# Patient Record
Sex: Female | Born: 1995
Health system: Southern US, Community
[De-identification: ages and names within clinical notes are randomized; demographics above are authoritative.]

## PROBLEM LIST (undated history)

## (undated) ENCOUNTER — Inpatient Hospital Stay (HOSPITAL_COMMUNITY): Payer: Self-pay

## (undated) DIAGNOSIS — Z789 Other specified health status: Secondary | ICD-10-CM

## (undated) DIAGNOSIS — B009 Herpesviral infection, unspecified: Secondary | ICD-10-CM

## (undated) DIAGNOSIS — Z3490 Encounter for supervision of normal pregnancy, unspecified, unspecified trimester: Secondary | ICD-10-CM

## (undated) HISTORY — PX: NO PAST SURGERIES: SHX2092

---
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
## 2013-03-22 ENCOUNTER — Encounter (HOSPITAL_COMMUNITY): Payer: Self-pay | Admitting: Emergency Medicine

## 2013-03-22 ENCOUNTER — Emergency Department (HOSPITAL_COMMUNITY)
Admission: EM | Admit: 2013-03-22 | Discharge: 2013-03-22 | Disposition: A | Discharge status: Home / Self Care | Payer: Self-pay | Attending: Emergency Medicine | Admitting: Emergency Medicine

## 2013-03-22 ENCOUNTER — Emergency Department (HOSPITAL_COMMUNITY): Payer: Self-pay

## 2013-03-22 DIAGNOSIS — N949 Unspecified condition associated with female genital organs and menstrual cycle: Secondary | ICD-10-CM | POA: Insufficient documentation

## 2013-03-22 DIAGNOSIS — R102 Pelvic and perineal pain: Secondary | ICD-10-CM

## 2013-03-22 DIAGNOSIS — R51 Headache: Secondary | ICD-10-CM | POA: Insufficient documentation

## 2013-03-22 DIAGNOSIS — Z3202 Encounter for pregnancy test, result negative: Secondary | ICD-10-CM | POA: Insufficient documentation

## 2013-03-22 DIAGNOSIS — J029 Acute pharyngitis, unspecified: Secondary | ICD-10-CM | POA: Insufficient documentation

## 2013-03-22 LAB — RAPID STREP SCREEN (MED CTR MEBANE ONLY): Streptococcus, Group A Screen (Direct): NEGATIVE

## 2013-03-22 LAB — URINALYSIS, ROUTINE W REFLEX MICROSCOPIC
Bilirubin Urine: NEGATIVE
Glucose, UA: NEGATIVE mg/dL
Specific Gravity, Urine: 1.004 — ABNORMAL LOW (ref 1.005–1.030)
Urobilinogen, UA: 1 mg/dL (ref 0.0–1.0)
pH: 7 (ref 5.0–8.0)

## 2013-03-22 LAB — URINE MICROSCOPIC-ADD ON

## 2013-03-22 LAB — WET PREP, GENITAL
Trich, Wet Prep: NONE SEEN
Yeast Wet Prep HPF POC: NONE SEEN

## 2013-03-22 LAB — POCT PREGNANCY, URINE: Preg Test, Ur: NEGATIVE

## 2013-03-22 MED ORDER — DIAZEPAM 2 MG PO TABS: 5.0000 mg | ORAL_TABLET | Freq: Two times a day (BID) | ORAL | Status: DC

## 2013-03-22 MED ORDER — IBUPROFEN 800 MG PO TABS: 800.0000 mg | ORAL_TABLET | Freq: Three times a day (TID) | ORAL | Status: DC

## 2013-03-22 MED ORDER — KETOROLAC TROMETHAMINE 60 MG/2ML IM SOLN
60.0000 mg | Freq: Once | INTRAMUSCULAR | Status: AC
  Administered 2013-03-22: 60 mg via INTRAMUSCULAR
  Filled 2013-03-22: qty 2

## 2013-03-22 MED ORDER — DIAZEPAM 2 MG PO TABS
2.0000 mg | ORAL_TABLET | Freq: Once | ORAL | Status: AC
  Administered 2013-03-22: 2 mg via ORAL
  Filled 2013-03-22: qty 1

## 2013-03-22 NOTE — ED Notes (Signed)
PT. REPORTS HEADACHE / SORE THROAT  FOR 2 DAYS UNRELIEVED BY OTC NYQUIL.

## 2013-03-22 NOTE — ED Notes (Signed)
Pelvic cart at bedside. 

## 2013-03-22 NOTE — ED Provider Notes (Signed)
History    CSN: [id number] Arrival date & time 03/22/13  0110  None    Chief Complaint  Patient presents with  . Headache  . Sore Throat   (Consider location/radiation/quality/duration/timing/severity/associated sxs/prior Treatment) HPI History provided by pt.   Pt presents w/ multiple complaints.  Has had a sore throat for the past two days.  Has had non-traumatic frontal headache and posterior neck pain for 1-2 days.  Neck pain aggravated by ROM.  Also c/o diffuse lower abd pain since yesterday am.  Associated sx include increased urinary frequency and single episode of diarrhea.  Denies fever, dizziness, vision changes, extremity weakness/paresthesias, nasal congestion/rhinorrhea, cough, N/V, hematochezia/melena, vaginal sx.  No h/o migraines.  H/o multiple STDs.  No known sick contacts.  History reviewed. No pertinent past medical history. History reviewed. No pertinent past surgical history. No family history on file. History  Substance Use Topics  . Smoking status: Never Smoker   . Smokeless tobacco: Never Used  . Alcohol Use: No   OB History   Grav Para Term Preterm Abortions TAB SAB Ect Mult Living   1              Review of Systems  All other systems reviewed and are negative.    Allergies  Review of patient's allergies indicates no known allergies.  Home Medications   Current Outpatient Rx  Name  Route  Sig  Dispense  Refill  . Pseudoeph-Doxylamine-DM-APAP (NYQUIL PO)   Oral   Take 30 mLs by mouth every 8 (eight) hours as needed (for cold symptoms).          BP 143/69  Pulse 114  Temp(Src) 98.4 F (36.9 C) (Oral)  Resp 16  SpO2 98%  LMP 02/18/2013 Physical Exam  Nursing note and vitals reviewed. Constitutional: She is oriented to person, place, and time. She appears well-developed and well-nourished. No distress.  HENT:  Head: Normocephalic and atraumatic.  Eyes:  Normal appearance  Neck: Normal range of motion.  No meningeal signs.  Mild,  diffuse posterior neck ttp.   Cardiovascular: Normal rate, regular rhythm and intact distal pulses.   Pulmonary/Chest: Effort normal and breath sounds normal.  Abdominal: Soft. Bowel sounds are normal. She exhibits no distension and no mass. There is no rebound and no guarding.  Diffuse lower abd ttp  Genitourinary:  No CVA ttp.  Nml external genitalia.  Mild vaginal bleeding.  Cervix closed and appears nml.  Bilateral adnexal ttp  Musculoskeletal: Normal range of motion.  Neurological: She is alert and oriented to person, place, and time. No sensory deficit. Coordination normal.  CN 3-12 intact.  No nystagmus.  5/5 and equal upper and lower extremity strength.  No past pointing.    Skin: Skin is warm and dry. No rash noted.  Psychiatric: She has a normal mood and affect. Her behavior is normal.    ED Course  Procedures (including critical care time) Labs Reviewed  URINALYSIS, ROUTINE W REFLEX MICROSCOPIC - Abnormal; Notable for the following:    Specific Gravity, Urine 1.004 (*)    Hgb urine dipstick MODERATE (*)    All other components within normal limits  WET PREP, GENITAL  GC/CHLAMYDIA PROBE AMP  URINE MICROSCOPIC-ADD ON  POCT PREGNANCY, URINE   US Transvaginal Non-ob  03/22/2013   *RADIOLOGY REPORT*  Clinical Data:  Pelvic pain.  TRANSABDOMINAL AND TRANSVAGINAL ULTRASOUND OF PELVIS DOPPLER ULTRASOUND OF OVARIES  Technique:  Both transabdominal and transvaginal ultrasound examinations of the pelvis were performed.  Transabdominal technique was performed for global imaging of the pelvis including uterus, ovaries, adnexal regions, and pelvic cul-de-sac.  It was necessary to proceed with endovaginal exam following the transabdominal exam to visualize the ovaries in greater detail.  Color and duplex Doppler ultrasound was utilized to evaluate blood flow to the ovaries.  Comparison:  Pelvic ultrasound performed 10/27/2012  FINDINGS  Uterus:  Normal in size and appearance; measures 7.5 x 4.3  x 5.2 cm.  Retroverted in nature.  Endometrium:  Normal in size and appearance; measures 0.4 cm in thickness.  Right ovary: Normal appearance/no adnexal mass; measures 3.9 x 1.7 x 2.1 cm.  Left ovary: Normal appearance/no adnexal mass; measures 3.6 x 1.5 x 2.0 cm.  Pulsed Doppler evaluation demonstrates normal low-resistance arterial and venous waveforms in both ovaries.  Trace free fluid is seen within the pelvic cul-de-sac.  IMPRESSION:  Normal exam.  No evidence of pelvic mass or other significant abnormality.  No sonographic evidence for ovarian torsion.   Original Report Authenticated By: Tonia Ghent, M.D.   US Pelvis Complete  03/22/2013   *RADIOLOGY REPORT*  Clinical Data:  Pelvic pain.  TRANSABDOMINAL AND TRANSVAGINAL ULTRASOUND OF PELVIS DOPPLER ULTRASOUND OF OVARIES  Technique:  Both transabdominal and transvaginal ultrasound examinations of the pelvis were performed. Transabdominal technique was performed for global imaging of the pelvis including uterus, ovaries, adnexal regions, and pelvic cul-de-sac.  It was necessary to proceed with endovaginal exam following the transabdominal exam to visualize the ovaries in greater detail.  Color and duplex Doppler ultrasound was utilized to evaluate blood flow to the ovaries.  Comparison:  Pelvic ultrasound performed 10/27/2012  FINDINGS  Uterus:  Normal in size and appearance; measures 7.5 x 4.3 x 5.2 cm.  Retroverted in nature.  Endometrium:  Normal in size and appearance; measures 0.4 cm in thickness.  Right ovary: Normal appearance/no adnexal mass; measures 3.9 x 1.7 x 2.1 cm.  Left ovary: Normal appearance/no adnexal mass; measures 3.6 x 1.5 x 2.0 cm.  Pulsed Doppler evaluation demonstrates normal low-resistance arterial and venous waveforms in both ovaries.  Trace free fluid is seen within the pelvic cul-de-sac.  IMPRESSION:  Normal exam.  No evidence of pelvic mass or other significant abnormality.  No sonographic evidence for ovarian torsion.    Original Report Authenticated By: Tonia Ghent, M.D.   [place] Art/ven Flow Abd Pelv Doppler  03/22/2013   *RADIOLOGY REPORT*  Clinical Data:  Pelvic pain.  TRANSABDOMINAL AND TRANSVAGINAL ULTRASOUND OF PELVIS DOPPLER ULTRASOUND OF OVARIES  Technique:  Both transabdominal and transvaginal ultrasound examinations of the pelvis were performed. Transabdominal technique was performed for global imaging of the pelvis including uterus, ovaries, adnexal regions, and pelvic cul-de-sac.  It was necessary to proceed with endovaginal exam following the transabdominal exam to visualize the ovaries in greater detail.  Color and duplex Doppler ultrasound was utilized to evaluate blood flow to the ovaries.  Comparison:  Pelvic ultrasound performed 10/27/2012  FINDINGS  Uterus:  Normal in size and appearance; measures 7.5 x 4.3 x 5.2 cm.  Retroverted in nature.  Endometrium:  Normal in size and appearance; measures 0.4 cm in thickness.  Right ovary: Normal appearance/no adnexal mass; measures 3.9 x 1.7 x 2.1 cm.  Left ovary: Normal appearance/no adnexal mass; measures 3.6 x 1.5 x 2.0 cm.  Pulsed Doppler evaluation demonstrates normal low-resistance arterial and venous waveforms in both ovaries.  Trace free fluid is seen within the pelvic cul-de-sac.  IMPRESSION:  Normal exam.  No evidence of pelvic mass  or other significant abnormality.  No sonographic evidence for ovarian torsion.   Original Report Authenticated By: Tonia Ghent, M.D.   1. Headache   2. Sore throat   3. Pelvic pain     MDM  Healthy 17yo F presents w/ multiple complaints.   Has a sore throat.  Low clinical suspicion for strep and rapid strep screen neg.  Also c/o non-traumatic posterior neck pain.  No mid-line tenderness, no NV deficits of extremities and no meningeal signs.  Will treat symptomatically for cervical strain vs. Myalgias secondary to viral illness.  Pain improved in ED w/ IM toradol and po valium.  Also c/o lower abd pain.  Abd  soft/non-distended, diffuse lower abd ttp, bilateral adnexal tenderness.  Transvaginal US ordered d/t lack of f/u and was unremarkable.  Pt reassured.  D/c'd home w/ eight 2mg  valium as well as 800mg  ibuprofen.  Advised to return for worsening sx.    Otilio Miu, PA-C 03/23/13 [phone]

## 2013-03-22 NOTE — ED Notes (Signed)
Pt transported to US

## 2013-03-22 NOTE — ED Notes (Signed)
PA at bedside.

## 2013-03-23 NOTE — ED Provider Notes (Signed)
Medical screening examination/treatment/procedure(s) were performed by non-physician practitioner and as supervising physician I was immediately available for consultation/collaboration.   Jaamal Farooqui, MD 03/23/13 2213 

## 2013-03-24 LAB — CULTURE, GROUP A STREP

## 2013-03-27 ENCOUNTER — Emergency Department (HOSPITAL_COMMUNITY)
Admission: EM | Admit: 2013-03-27 | Discharge: 2013-03-27 | Disposition: A | Discharge status: Home / Self Care | Payer: Self-pay | Attending: Emergency Medicine | Admitting: Emergency Medicine

## 2013-03-27 ENCOUNTER — Encounter (HOSPITAL_COMMUNITY): Payer: Self-pay | Admitting: *Deleted

## 2013-03-27 DIAGNOSIS — H5789 Other specified disorders of eye and adnexa: Secondary | ICD-10-CM | POA: Insufficient documentation

## 2013-03-27 DIAGNOSIS — B309 Viral conjunctivitis, unspecified: Secondary | ICD-10-CM

## 2013-03-27 DIAGNOSIS — Z79899 Other long term (current) drug therapy: Secondary | ICD-10-CM | POA: Insufficient documentation

## 2013-03-27 MED ORDER — HYPROMELLOSE (GONIOSCOPIC) 2.5 % OP SOLN: 1.0000 [drp] | Freq: Once | OPHTHALMIC | Status: DC | PRN

## 2013-03-27 MED ORDER — FLUORESCEIN SODIUM 1 MG OP STRP
1.0000 | ORAL_STRIP | Freq: Once | OPHTHALMIC | Status: AC
Start: 2013-03-27 — End: 2013-03-27
  Administered 2013-03-27: 1 via OPHTHALMIC
  Filled 2013-03-27: qty 1

## 2013-03-27 MED ORDER — NAPHAZOLINE-PHENIRAMINE 0.025-0.3 % OP SOLN: 1.0000 [drp] | Freq: Four times a day (QID) | OPHTHALMIC | Status: DC

## 2013-03-27 MED ORDER — TETRACAINE HCL 0.5 % OP SOLN
1.0000 [drp] | Freq: Once | OPHTHALMIC | Status: AC
  Administered 2013-03-27: 1 [drp] via OPHTHALMIC
  Filled 2013-03-27: qty 2

## 2013-03-27 NOTE — ED Notes (Signed)
Pt is here with redness and irritation to right eye since Saturday.  No vision change

## 2013-03-27 NOTE — ED Provider Notes (Signed)
History  This chart was scribed for Glade Nurse, PA-C working with Enid Skeens, MD by Greggory Stallion, ED scribe. This patient was seen in room TR04C/TR04C and the patient's care was started at 3:36 PM.  CSN: [id number] Arrival date & time 03/27/13  1431   Chief Complaint  Patient presents with  . Eye Problem   The history is provided by the patient. No language interpreter was used.    HPI Comments: Sarah Hester is a 17 y.o. female who presents to the Emergency Department complaining of gradual onset, constant right eye irritation with associated right eye redness that started Saturday. This is a new problem. She rates the pain 4/10. Localized. Pt states there is no pain when she moves her eye side to side. Pt states when she first noticed it in the mirror her eye was just red. Pt states the next day it worsened and she had a clear discharge coming from her right eye. Pt states she has used Visene with some relief. Pt denies sick contact, fever, sore throat, rhinorrhea, loss of vision, photophobia, headache, and any visual changes. She states she does not typically have seasonal allergies.    History reviewed. No pertinent past medical history. History reviewed. No pertinent past surgical history. No family history on file. History  Substance Use Topics  . Smoking status: Never Smoker   . Smokeless tobacco: Never Used  . Alcohol Use: No   OB History   Grav Para Term Preterm Abortions TAB SAB Ect Mult Living   1              Review of Systems  Constitutional: Negative for fever and diaphoresis.  HENT: Negative for congestion, sore throat, rhinorrhea, sneezing, neck pain, neck stiffness and sinus pressure.   Eyes: Positive for redness. Negative for photophobia, pain and visual disturbance. Eye discharge: clear.  Respiratory: Negative for apnea, chest tightness and shortness of breath.   Cardiovascular: Negative for chest pain and palpitations.  Gastrointestinal: Negative for  nausea and vomiting.  Genitourinary: Negative for dysuria.  Musculoskeletal: Negative for gait problem.  Skin: Negative for rash.  Neurological: Negative for dizziness and light-headedness.    Allergies  Review of patient's allergies indicates no known allergies.  Home Medications   Current Outpatient Rx  Name  Route  Sig  Dispense  Refill  . Multiple Vitamins-Minerals (MULTIVITAMIN PO)   Oral   Take 1 tablet by mouth daily.         Marland Kitchen tetrahydrozoline (VISINE) 0.05 % ophthalmic solution   Right Eye   Place 1 drop into the right eye 2 (two) times daily as needed (for eye irritation).          BP 129/69  Pulse 110  Temp(Src) 99 F (37.2 C) (Oral)  Resp 18  SpO2 98%  LMP 03/20/2013  Physical Exam  Nursing note and vitals reviewed. Constitutional: She is oriented to person, place, and time. She appears well-developed and well-nourished. No distress.  HENT:  Head: Normocephalic and atraumatic.  Eyes: EOM are normal. Pupils are equal, round, and reactive to light. No foreign bodies found. Right eye exhibits discharge. Right eye exhibits no exudate and no hordeolum. No foreign body present in the right eye. Right conjunctiva is not injected. Right conjunctiva has no hemorrhage. Right eye exhibits normal extraocular motion.    Sclera of right eye has mild redness. Conjunctiva are not injected. Clear discharge noted.   Neck: Normal range of motion. Neck supple.  No meningeal signs  Cardiovascular: Normal rate, regular rhythm and normal heart sounds.  Exam reveals no gallop and no friction rub.   No murmur heard. Pulmonary/Chest: Effort normal and breath sounds normal. No respiratory distress. She has no wheezes. She has no rales. She exhibits no tenderness.  Abdominal: Soft. Bowel sounds are normal. She exhibits no distension. There is no tenderness. There is no rebound and no guarding.  Musculoskeletal: Normal range of motion. She exhibits no edema and no tenderness.   Neurological: She is alert and oriented to person, place, and time. No cranial nerve deficit.  Skin: Skin is warm and dry. She is not diaphoretic. No erythema.    ED Course  Procedures (including critical care time)  DIAGNOSTIC STUDIES: Oxygen Saturation is 98% on RA, normal by my interpretation.    COORDINATION OF CARE: 4:28 PM-Discussed treatment plan which includes warm compresses with pt at bedside and pt agreed to plan. Advised pt to follow up with a PCP if symptoms worsen.   Labs Reviewed - No data to display No results found. 1. Viral conjunctivitis     MDM  Patient presentation consistent with viral conjunctivitis.  No purulent discharge. No corneal abrasions, consensual photophobia, or dendritic staining with fluorescein study.  Presentation non-concerning for iritis, bacterial conjunctivitis, corneal abrasions, or HSV.  No antibiotics are indicated and patient will be prescribed naphazoline for itching.  Personal hygiene and frequent handwashing discussed.  Patient advised to followup with ophthalmologist if symptoms persist or worsen in any way including vision change or purulent discharge.  Patient verbalizes understanding and is agreeable with discharge.  I personally performed the services described in this documentation, which was scribed in my presence. The recorded information has been reviewed and is accurate.    Glade Nurse, PA-C 03/27/13 1754

## 2013-03-29 NOTE — ED Provider Notes (Signed)
Medical screening examination/treatment/procedure(s) were performed by non-physician practitioner and as supervising physician I was immediately available for consultation/collaboration.   Enid Skeens, MD 03/29/13 [phone]

## 2013-05-14 ENCOUNTER — Emergency Department (HOSPITAL_COMMUNITY): Payer: Self-pay

## 2013-05-14 ENCOUNTER — Emergency Department (HOSPITAL_COMMUNITY)
Admission: EM | Admit: 2013-05-14 | Discharge: 2013-05-14 | Discharge status: Against Medical Advice | Payer: Self-pay | Attending: Emergency Medicine | Admitting: Emergency Medicine

## 2013-05-14 ENCOUNTER — Encounter (HOSPITAL_COMMUNITY): Payer: Self-pay | Admitting: Emergency Medicine

## 2013-05-14 DIAGNOSIS — R1011 Right upper quadrant pain: Secondary | ICD-10-CM | POA: Insufficient documentation

## 2013-05-14 DIAGNOSIS — Z3202 Encounter for pregnancy test, result negative: Secondary | ICD-10-CM | POA: Insufficient documentation

## 2013-05-14 DIAGNOSIS — Z79899 Other long term (current) drug therapy: Secondary | ICD-10-CM | POA: Insufficient documentation

## 2013-05-14 DIAGNOSIS — R109 Unspecified abdominal pain: Secondary | ICD-10-CM

## 2013-05-14 LAB — URINALYSIS, ROUTINE W REFLEX MICROSCOPIC
Glucose, UA: NEGATIVE mg/dL
Hgb urine dipstick: NEGATIVE
Leukocytes, UA: NEGATIVE
Specific Gravity, Urine: 1.002 — ABNORMAL LOW (ref 1.005–1.030)
Urobilinogen, UA: 0.2 mg/dL (ref 0.0–1.0)

## 2013-05-14 LAB — CBC WITH DIFFERENTIAL/PLATELET
Basophils Absolute: 0 10*3/uL (ref 0.0–0.1)
Basophils Relative: 0 % (ref 0–1)
Eosinophils Relative: 3 % (ref 0–5)
HCT: 35.4 % — ABNORMAL LOW (ref 36.0–46.0)
MCHC: 34.5 g/dL (ref 30.0–36.0)
MCV: 87.2 fL (ref 78.0–100.0)
Monocytes Absolute: 0.5 10*3/uL (ref 0.1–1.0)
RDW: 13.1 % (ref 11.5–15.5)

## 2013-05-14 LAB — COMPREHENSIVE METABOLIC PANEL
AST: 14 U/L (ref 0–37)
Albumin: 3.9 g/dL (ref 3.5–5.2)
Calcium: 9.1 mg/dL (ref 8.4–10.5)
Creatinine, Ser: 0.62 mg/dL (ref 0.50–1.10)
GFR calc non Af Amer: >90 mL/min (ref >90)

## 2013-05-14 LAB — LIPASE, BLOOD: Lipase: 46 U/L (ref 11–59)

## 2013-05-14 LAB — POCT PREGNANCY, URINE: Preg Test, Ur: NEGATIVE

## 2013-05-14 NOTE — ED Notes (Signed)
Unable to locate pt x3 attempts 

## 2013-05-14 NOTE — ED Notes (Signed)
Patient transported to Ultrasound 

## 2013-05-14 NOTE — ED Notes (Signed)
Pt not in exam room, attempted to call pt's name in the waiting area as well with out response. [place] reports pt was returned prior to 1900

## 2013-05-14 NOTE — ED Notes (Signed)
Right sided intermittent flank/abdominal pain x 2-3 days, worse at night. No nausea/vomiting. Pt c/o white cloudy discharge. LMP 8/13. No unprotected sex. No problems urinating.

## 2013-05-14 NOTE — ED Provider Notes (Signed)
CSN: [id number]     Arrival date & time 05/14/13  1501 History   First MD Initiated Contact with Patient 05/14/13 1716     Chief Complaint  Patient presents with  . Abdominal Pain    HPI Pt was seen at 1720.  Per pt, c/o gradual onset and persistence of constant RUQ abd "pain" for the past 1 week. Describes the abd pain as "cramping."  Denies N/V/D, no fevers, no back pain, no rash, no CP/SOB, no black or blood in stools or emesis.      History reviewed. No pertinent past medical history.  History reviewed. No pertinent past surgical history.  History  Substance Use Topics  . Smoking status: Never Smoker   . Smokeless tobacco: Never Used  . Alcohol Use: No    Review of Systems ROS: Statement: All systems negative except as marked or noted in the HPI; Constitutional: Negative for fever and chills. ; ; Eyes: Negative for eye pain, redness and discharge. ; ; ENMT: Negative for ear pain, hoarseness, nasal congestion, sinus pressure and sore throat. ; ; Cardiovascular: Negative for chest pain, palpitations, diaphoresis, dyspnea and peripheral edema. ; ; Respiratory: Negative for cough, wheezing and stridor. ; ; Gastrointestinal: +abd pain. Negative for nausea, vomiting, diarrhea, blood in stool, hematemesis, jaundice and rectal bleeding. . ; ; Genitourinary: Negative for dysuria, flank pain and hematuria. ; ; Musculoskeletal: Negative for back pain and neck pain. Negative for swelling and trauma.; ; Skin: Negative for pruritus, rash, abrasions, blisters, bruising and skin lesion.; ; Neuro: Negative for headache, lightheadedness and neck stiffness. Negative for weakness, altered level of consciousness , altered mental status, extremity weakness, paresthesias, involuntary movement, seizure and syncope.       Allergies  Review of patient's allergies indicates no known allergies.  Home Medications   Current Outpatient Rx  Name  Route  Sig  Dispense  Refill  . acetaminophen (TYLENOL) 325 MG  tablet   Oral   Take 650 mg by mouth every 6 (six) hours as needed for pain.         . Multiple Vitamins-Minerals (MULTIVITAMIN PO)   Oral   Take 1 tablet by mouth daily.         Marland Kitchen tetrahydrozoline (VISINE) 0.05 % ophthalmic solution   Right Eye   Place 1 drop into the right eye 2 (two) times daily as needed (for eye irritation).          BP 128/60  Pulse 100  Temp(Src) 98 F (36.7 C) (Oral)  Resp 18  SpO2 100% Physical Exam 1725: Physical examination:  Nursing notes reviewed; Vital signs and O2 SAT reviewed;  Constitutional: Well developed, Well nourished, Well hydrated, In no acute distress; Head:  Normocephalic, atraumatic; Eyes: EOMI, PERRL, No scleral icterus; ENMT: Mouth and pharynx normal, Mucous membranes moist; Neck: Supple, Full range of motion, No lymphadenopathy; Cardiovascular: Regular rate and rhythm, No murmur, rub, or gallop; Respiratory: Breath sounds clear & equal bilaterally, No rales, rhonchi, wheezes.  Speaking full sentences with ease, Normal respiratory effort/excursion; Chest: Nontender, Movement normal; Abdomen: Soft, +mild RUQ tenderness to palp. No rebound or guarding. Nondistended, Normal bowel sounds; Genitourinary: No CVA tenderness; Extremities: Pulses normal, No tenderness, No edema, No calf edema or asymmetry.; Neuro: AA&Ox3, Major CN grossly intact.  Speech clear. No gross focal motor or sensory deficits in extremities.; Skin: Color normal, Warm, Dry.   ED Course  Procedures   MDM  MDM Reviewed: previous chart, vitals and nursing note Reviewed previous:  labs Interpretation: labs, ultrasound and x-ray   Results for orders placed during the hospital encounter of 05/14/13  CBC WITH DIFFERENTIAL      Result Value Range   WBC 6.6  4.0 - 10.5 K/uL   RBC 4.06  3.87 - 5.11 MIL/uL   Hemoglobin 12.2  12.0 - 15.0 g/dL   HCT 95.6 (*) 21.3 - 08.6 %   MCV 87.2  78.0 - 100.0 fL   MCH 30.0  26.0 - 34.0 pg   MCHC 34.5  30.0 - 36.0 g/dL   RDW 57.8   46.9 - 62.9 %   Platelets 256  150 - 400 K/uL   Neutrophils Relative % 65  43 - 77 %   Neutro Abs 4.3  1.7 - 7.7 K/uL   Lymphocytes Relative 25  12 - 46 %   Lymphs Abs 1.7  0.7 - 4.0 K/uL   Monocytes Relative 7  3 - 12 %   Monocytes Absolute 0.5  0.1 - 1.0 K/uL   Eosinophils Relative 3  0 - 5 %   Eosinophils Absolute 0.2  0.0 - 0.7 K/uL   Basophils Relative 0  0 - 1 %   Basophils Absolute 0.0  0.0 - 0.1 K/uL  COMPREHENSIVE METABOLIC PANEL      Result Value Range   Sodium 138  135 - 145 mEq/L   Potassium 3.7  3.5 - 5.1 mEq/L   Chloride 105  96 - 112 mEq/L   CO2 24  19 - 32 mEq/L   Glucose, Bld 74  70 - 99 mg/dL   BUN 6  6 - 23 mg/dL   Creatinine, Ser 5.28  0.50 - 1.10 mg/dL   Calcium 9.1  8.4 - 41.3 mg/dL   Total Protein 7.1  6.0 - 8.3 g/dL   Albumin 3.9  3.5 - 5.2 g/dL   AST 14  0 - 37 U/L   ALT 10  0 - 35 U/L   Alkaline Phosphatase 57  39 - 117 U/L   Total Bilirubin 0.2 (*) 0.3 - 1.2 mg/dL   GFR calc non Af Amer >90  >90 mL/min   GFR calc Af Amer >90  >90 mL/min  URINALYSIS, ROUTINE W REFLEX MICROSCOPIC      Result Value Range   Color, Urine YELLOW  YELLOW   APPearance CLEAR  CLEAR   Specific Gravity, Urine 1.002 (*) 1.005 - 1.030   pH 7.0  5.0 - 8.0   Glucose, UA NEGATIVE  NEGATIVE mg/dL   Hgb urine dipstick NEGATIVE  NEGATIVE   Bilirubin Urine NEGATIVE  NEGATIVE   Ketones, ur NEGATIVE  NEGATIVE mg/dL   Protein, ur NEGATIVE  NEGATIVE mg/dL   Urobilinogen, UA 0.2  0.0 - 1.0 mg/dL   Nitrite NEGATIVE  NEGATIVE   Leukocytes, UA NEGATIVE  NEGATIVE  LIPASE, BLOOD      Result Value Range   Lipase 46  11 - 59 U/L  POCT PREGNANCY, URINE      Result Value Range   Preg Test, Ur NEGATIVE  NEGATIVE   Dg Chest 2 View 05/14/2013   *RADIOLOGY REPORT*  Clinical Data: Right-sided abdominal pain extending into the lateral chest for 2 days.  CHEST - 2 VIEW  Comparison: None.  Findings: Heart size is normal.  The lungs are free of focal consolidations and pleural effusions.  No  pulmonary edema. Visualized osseous structures have a normal appearance.  Visualized portion of the upper abdomen is unremarkable.  No free intraperitoneal air  beneath diaphragm.  IMPRESSION: No evidence for acute  abnormality.   Original Report Authenticated By: Norva Pavlov, M.D.   US Abdomen Complete 05/14/2013   *RADIOLOGY REPORT*  Abdominal ultrasound  History:  Upper abdominal pain  Comparison:  None  Findings:  Gallbladder is visualized in multiple projections. There are no gallstones, gallbladder wall thickening, or pericholecystic fluid collection.  There is no intrahepatic, common hepatic, or common bile duct dilatation.  The pancreas appears normal.  No focal liver lesions are identified.  Spleen is normal in size and homogeneous in echotexture.  Kidneys appear normal bilaterally.  There is no ascites.  Aorta is nonaneurysmal. Inferior vena cava appears normal.  Conclusion:  Study within normal limits.   Original Report Authenticated By: Bretta Bang, M.D.    2000:  Pt not found in room for the past 1+ hour since results returned. Assumed AWOL.      Laray Anger, DO 05/15/13 2111

## 2013-05-14 NOTE — ED Notes (Signed)
Pt c/o RLQ pain x 3 days; pt sts some white vaginal discharge and LMP was 8/13

## 2013-05-14 NOTE — ED Notes (Signed)
Unable to locate pt, pt not in exam room.

## 2013-05-22 ENCOUNTER — Emergency Department (HOSPITAL_COMMUNITY)
Admission: EM | Admit: 2013-05-22 | Discharge: 2013-05-22 | Disposition: A | Discharge status: Home / Self Care | Payer: Self-pay | Attending: Emergency Medicine | Admitting: Emergency Medicine

## 2013-05-22 ENCOUNTER — Encounter (HOSPITAL_COMMUNITY): Payer: Self-pay | Admitting: Emergency Medicine

## 2013-05-22 DIAGNOSIS — L509 Urticaria, unspecified: Secondary | ICD-10-CM | POA: Insufficient documentation

## 2013-05-22 DIAGNOSIS — R000 Tachycardia, unspecified: Secondary | ICD-10-CM | POA: Insufficient documentation

## 2013-05-22 DIAGNOSIS — R21 Rash and other nonspecific skin eruption: Secondary | ICD-10-CM

## 2013-05-22 DIAGNOSIS — Z79899 Other long term (current) drug therapy: Secondary | ICD-10-CM | POA: Insufficient documentation

## 2013-05-22 MED ORDER — PREDNISONE 20 MG PO TABS
60.0000 mg | ORAL_TABLET | Freq: Once | ORAL | Status: AC
  Administered 2013-05-22: 60 mg via ORAL
  Filled 2013-05-22: qty 3

## 2013-05-22 MED ORDER — PREDNISONE 20 MG PO TABS: ORAL_TABLET | ORAL | Status: DC

## 2013-05-22 NOTE — ED Notes (Signed)
Pt here for generalized hives x 2 days

## 2013-05-22 NOTE — ED Notes (Signed)
Pt comfortable with d/c and f/u instructions. Prescriptions x1 

## 2013-05-22 NOTE — ED Provider Notes (Signed)
Medical screening examination/treatment/procedure(s) were performed by non-physician practitioner and as supervising physician I was immediately available for consultation/collaboration.  Donnetta Hutching, MD 05/22/13 2233

## 2013-05-22 NOTE — ED Provider Notes (Signed)
CSN: [id number]     Arrival date & time 05/22/13  1341 History   First MD Initiated Contact with Patient 05/22/13 1505     Chief Complaint  Patient presents with  . Urticaria   (Consider location/radiation/quality/duration/timing/severity/associated sxs/prior Treatment) HPI Comments: Female with no significant past medical history presents to the emergency department complaining of a rash x2 days. Patient states she woke up yesterday morning and noticed she had hives on her right arm, throughout the day it spread to her left arm and her chest. Today the rash continued to spread onto her face and her back. Rash is itchy. Denies any new soaps, detergents, pets or recent travel. No new medications. She did sleep over a friend's house 2 days ago, however that friend does not have any similar rashes. Denies any new foods. She works at [employer] out and comes in contact with many people throughout the day. Denies any difficulty breathing or swallowing.  Patient is a 17 y.o. female presenting with urticaria. The history is provided by the patient and a significant other.  Urticaria Associated symptoms include a rash. Pertinent negatives include no arthralgias, chills, fever or myalgias.    History reviewed. No pertinent past medical history. History reviewed. No pertinent past surgical history. History reviewed. No pertinent family history. History  Substance Use Topics  . Smoking status: Never Smoker   . Smokeless tobacco: Never Used  . Alcohol Use: No   OB History   Grav Para Term Preterm Abortions TAB SAB Ect Mult Living   1              Review of Systems  Constitutional: Negative for fever and chills.  HENT: Negative for facial swelling.   Respiratory: Negative for shortness of breath and wheezing.   Musculoskeletal: Negative for myalgias and arthralgias.  Skin: Positive for rash.  All other systems reviewed and are negative.    Allergies  Review of patient's allergies indicates no  known allergies.  Home Medications   Current Outpatient Rx  Name  Route  Sig  Dispense  Refill  . acetaminophen (TYLENOL) 325 MG tablet   Oral   Take 650 mg by mouth every 6 (six) hours as needed for pain.         . Multiple Vitamins-Minerals (MULTIVITAMIN PO)   Oral   Take 1 tablet by mouth daily.         Marland Kitchen tetrahydrozoline (VISINE) 0.05 % ophthalmic solution   Right Eye   Place 1 drop into the right eye 2 (two) times daily as needed (for eye irritation).          BP 127/77  Pulse 123  Temp(Src) 98.4 F (36.9 C) (Oral)  Resp 18  Ht 5\' 6"  (1.676 m)  Wt 153 lb (69.4 kg)  BMI 24.71 kg/m2  SpO2 99%  LMP 04/18/2013 Physical Exam  Nursing note and vitals reviewed. Constitutional: She is oriented to person, place, and time. She appears well-developed and well-nourished. No distress.  HENT:  Head: Normocephalic and atraumatic.  Mouth/Throat: Oropharynx is clear and moist.  Eyes: Conjunctivae and EOM are normal. Pupils are equal, round, and reactive to light.  Neck: Normal range of motion. Neck supple.  Cardiovascular: Regular rhythm and normal heart sounds.  Tachycardia present.   Pulmonary/Chest: Effort normal and breath sounds normal.  Musculoskeletal: Normal range of motion. She exhibits no edema.  Neurological: She is alert and oriented to person, place, and time.  Skin: Skin is warm and dry. Rash noted.  She is not diaphoretic.  Urticarial rash to bilateral forearms, chest, back and forehead. Spares the palms of hands and soles of feet, no mucosal involvement.  Psychiatric: She has a normal mood and affect. Her behavior is normal.    ED Course  Procedures (including critical care time) Labs Review Labs Reviewed - No data to display Imaging Review No results found.  MDM   1. Urticaria   2. Rash    Patient with urticaria. No respiratory or airway compromise. No mucosal involvement. She is well appearing and in no apparent distress. She will be treated with  prednisone and Benadryl. Return precautions discussed. Patient states understanding of plan and is agreeable.    Trevor Mace, PA-C 05/22/13 1600

## 2013-06-14 ENCOUNTER — Emergency Department (HOSPITAL_COMMUNITY): Payer: Medicaid Other

## 2013-06-14 ENCOUNTER — Emergency Department (HOSPITAL_COMMUNITY)
Admission: EM | Admit: 2013-06-14 | Discharge: 2013-06-14 | Disposition: A | Discharge status: Home / Self Care | Payer: Medicaid Other | Attending: Emergency Medicine | Admitting: Emergency Medicine

## 2013-06-14 ENCOUNTER — Encounter (HOSPITAL_COMMUNITY): Payer: Self-pay | Admitting: Emergency Medicine

## 2013-06-14 DIAGNOSIS — O9989 Other specified diseases and conditions complicating pregnancy, childbirth and the puerperium: Secondary | ICD-10-CM | POA: Insufficient documentation

## 2013-06-14 DIAGNOSIS — R109 Unspecified abdominal pain: Secondary | ICD-10-CM | POA: Diagnosis present

## 2013-06-14 DIAGNOSIS — R82998 Other abnormal findings in urine: Secondary | ICD-10-CM | POA: Insufficient documentation

## 2013-06-14 DIAGNOSIS — R8281 Pyuria: Secondary | ICD-10-CM | POA: Diagnosis present

## 2013-06-14 DIAGNOSIS — R1031 Right lower quadrant pain: Secondary | ICD-10-CM | POA: Insufficient documentation

## 2013-06-14 DIAGNOSIS — Z79899 Other long term (current) drug therapy: Secondary | ICD-10-CM | POA: Insufficient documentation

## 2013-06-14 DIAGNOSIS — R1032 Left lower quadrant pain: Secondary | ICD-10-CM | POA: Insufficient documentation

## 2013-06-14 LAB — COMPREHENSIVE METABOLIC PANEL
ALT: 12 U/L (ref 0–35)
Albumin: 3.7 g/dL (ref 3.5–5.2)
Alkaline Phosphatase: 43 U/L (ref 39–117)
Calcium: 9 mg/dL (ref 8.4–10.5)
Potassium: 4 mEq/L (ref 3.5–5.1)
Sodium: 135 mEq/L (ref 135–145)
Total Protein: 7 g/dL (ref 6.0–8.3)

## 2013-06-14 LAB — CBC WITH DIFFERENTIAL/PLATELET
Basophils Absolute: 0 10*3/uL (ref 0.0–0.1)
Basophils Relative: 0 % (ref 0–1)
Eosinophils Absolute: 0.1 10*3/uL (ref 0.0–0.7)
MCH: 30.2 pg (ref 26.0–34.0)
MCHC: 34.9 g/dL (ref 30.0–36.0)
Neutrophils Relative %: 72 % (ref 43–77)
Platelets: 284 10*3/uL (ref 150–400)
RBC: 3.88 MIL/uL (ref 3.87–5.11)
RDW: 13.2 % (ref 11.5–15.5)

## 2013-06-14 LAB — WET PREP, GENITAL: Yeast Wet Prep HPF POC: NONE SEEN

## 2013-06-14 LAB — URINE MICROSCOPIC-ADD ON

## 2013-06-14 LAB — URINALYSIS, ROUTINE W REFLEX MICROSCOPIC
Bilirubin Urine: NEGATIVE
Ketones, ur: NEGATIVE mg/dL
Nitrite: NEGATIVE
Protein, ur: NEGATIVE mg/dL
Specific Gravity, Urine: 1.03 (ref 1.005–1.030)
Urobilinogen, UA: 1 mg/dL (ref 0.0–1.0)

## 2013-06-14 MED ORDER — CEPHALEXIN 250 MG PO CAPS
500.0000 mg | ORAL_CAPSULE | Freq: Once | ORAL | Status: AC
  Administered 2013-06-14: 500 mg via ORAL
  Filled 2013-06-14: qty 2

## 2013-06-14 MED ORDER — CEPHALEXIN 500 MG PO CAPS: 500.0000 mg | ORAL_CAPSULE | Freq: Two times a day (BID) | ORAL | Status: DC

## 2013-06-14 NOTE — ED Notes (Signed)
The tech assisted patient in ultrasound dept. The RN in charge is aware.

## 2013-06-14 NOTE — ED Provider Notes (Signed)
CSN: [id number]     Arrival date & time 06/14/13  1841 History   First MD Initiated Contact with Patient 06/14/13 2107     Chief Complaint  Patient presents with  . Abdominal Pain   (Consider location/radiation/quality/duration/timing/severity/associated sxs/prior Treatment) Patient is a 17 y.o. female presenting with abdominal pain. The history is provided by the patient.  Abdominal Pain Pain location:  LLQ and RLQ Pain quality: sharp   Pain radiates to:  Does not radiate Pain severity:  Mild Onset quality:  Gradual Duration:  1 day Timing:  Intermittent Progression:  Unchanged Relieved by: tylenol. Worsened by:  Nothing tried Ineffective treatments:  None tried Associated symptoms: no chest pain, no cough, no diarrhea, no dysuria, no fatigue, no fever, no hematuria, no nausea, no shortness of breath and no vomiting     History reviewed. No pertinent past medical history. History reviewed. No pertinent past surgical history. No family history on file. History  Substance Use Topics  . Smoking status: Never Smoker   . Smokeless tobacco: Never Used  . Alcohol Use: No   OB History   Grav Para Term Preterm Abortions TAB SAB Ect Mult Living   2              Review of Systems  Constitutional: Negative for fever and fatigue.  HENT: Negative for congestion and drooling.   Eyes: Negative for pain.  Respiratory: Negative for cough and shortness of breath.   Cardiovascular: Negative for chest pain.  Gastrointestinal: Positive for abdominal pain. Negative for nausea, vomiting and diarrhea.  Genitourinary: Negative for dysuria and hematuria.  Musculoskeletal: Negative for back pain, gait problem and neck pain.  Skin: Negative for color change.  Neurological: Negative for dizziness and headaches.  Hematological: Negative for adenopathy.  Psychiatric/Behavioral: Negative for behavioral problems.  All other systems reviewed and are negative.    Allergies  Review of patient's  allergies indicates no known allergies.  Home Medications   Current Outpatient Rx  Name  Route  Sig  Dispense  Refill  . acetaminophen (TYLENOL) 325 MG tablet   Oral   Take 650 mg by mouth every 6 (six) hours as needed for pain.         . Multiple Vitamins-Minerals (MULTIVITAMIN PO)   Oral   Take 1 tablet by mouth daily.         . predniSONE (DELTASONE) 20 MG tablet      2 tabs po daily x 3 days   6 tablet   0   . tetrahydrozoline (VISINE) 0.05 % ophthalmic solution   Right Eye   Place 1 drop into the right eye 2 (two) times daily as needed (for eye irritation).          BP 117/54  Pulse 64  Temp(Src) 97.2 F (36.2 C) (Oral)  Resp 18  Wt 128 lb (58.06 kg)  BMI 20.67 kg/m2  SpO2 100%  LMP 04/08/2013 Physical Exam  Nursing note and vitals reviewed. Constitutional: She is oriented to person, place, and time. She appears well-developed and well-nourished.  HENT:  Head: Normocephalic.  Mouth/Throat: No oropharyngeal exudate.  Eyes: Conjunctivae and EOM are normal. Pupils are equal, round, and reactive to light.  Neck: Normal range of motion. Neck supple.  Cardiovascular: Normal rate, regular rhythm, normal heart sounds and intact distal pulses.  Exam reveals no gallop and no friction rub.   No murmur heard. Pulmonary/Chest: Effort normal and breath sounds normal. No respiratory distress. She has no wheezes.  Abdominal: Soft. Bowel sounds are normal. There is tenderness (mild ttp of right adnexal and left adnexal areas, slightly worse on the right). There is no rebound and no guarding.  Genitourinary:  Normal appearing external vagina. Normal-appearing cervix. Os closed. Small amount of white fluid in the posterior fornix. No cervical motion tenderness. Bilateral mild adnexal tenderness to palpation.  Musculoskeletal: Normal range of motion. She exhibits no edema and no tenderness.  Neurological: She is alert and oriented to person, place, and time.  Skin: Skin is  warm and dry.  Psychiatric: She has a normal mood and affect. Her behavior is normal.    ED Course  Procedures (including critical care time) Labs Review Labs Reviewed  WET PREP, GENITAL - Abnormal; Notable for the following:    Clue Cells Wet Prep HPF POC FEW (*)    WBC, Wet Prep HPF POC FEW (*)    All other components within normal limits  URINALYSIS, ROUTINE W REFLEX MICROSCOPIC - Abnormal; Notable for the following:    APPearance CLOUDY (*)    Leukocytes, UA MODERATE (*)    All other components within normal limits  CBC WITH DIFFERENTIAL - Abnormal; Notable for the following:    Hemoglobin 11.7 (*)    HCT 33.5 (*)    All other components within normal limits  COMPREHENSIVE METABOLIC PANEL - Abnormal; Notable for the following:    BUN 4 (*)    All other components within normal limits  HCG, QUANTITATIVE, PREGNANCY - Abnormal; Notable for the following:    hCG, Beta Chain, Quant, [place] [id number] (*)    All other components within normal limits  URINE MICROSCOPIC-ADD ON - Abnormal; Notable for the following:    Squamous Epithelial / LPF MANY (*)    Bacteria, UA FEW (*)    All other components within normal limits  POCT PREGNANCY, URINE - Abnormal; Notable for the following:    Preg Test, Ur POSITIVE (*)    All other components within normal limits  URINE CULTURE  GC/CHLAMYDIA PROBE AMP   Imaging Review US Ob Comp Less 14 Wks  06/14/2013   CLINICAL DATA:  Right lower quadrant pain  EXAM: TRANSVAGINAL OB ULTRASOUND; OBSTETRIC <14 WK ULTRASOUND  TECHNIQUE: Transvaginal ultrasound was performed for complete evaluation of the gestation as well as the maternal uterus, adnexal regions, and pelvic cul-de-sac.  COMPARISON:  Prior abdominal ultrasound 05/14/2013  FINDINGS: Intrauterine gestational sac: Single intrauterine gestational sac identified.  Yolk sac:  Present  Embryo:  Present  Cardiac Activity: Yes  Heart Rate: 165 bpm  CRL:   17 mm  mm   8 w 1 d                  [place] EDC: Jan 23, 2014   Maternal uterus/adnexae: Sonographically unremarkable bilateral ovaries. No free fluid. No subchorionic hemorrhage. Contraction/thickening of the uterine myometrium was noted during the realtime sonographic examination of per the sonographer.  IMPRESSION: 1. Viable single IUP. The estimated gestational age by and crown-rump length is 8 weeks 1 day and the fetal heart rate is 165 beats per min. 2. Contractions/thickening of the uterine myometrium was noted by the sonographer during real-time imaging.   Electronically Signed   By: Malachy Moan M.D.   On: 06/14/2013 23:07   US Ob Transvaginal  06/14/2013   CLINICAL DATA:  Right lower quadrant pain  EXAM: TRANSVAGINAL OB ULTRASOUND; OBSTETRIC <14 WK ULTRASOUND  TECHNIQUE: Transvaginal ultrasound was performed for complete evaluation of the gestation as  well as the maternal uterus, adnexal regions, and pelvic cul-de-sac.  COMPARISON:  Prior abdominal ultrasound 05/14/2013  FINDINGS: Intrauterine gestational sac: Single intrauterine gestational sac identified.  Yolk sac:  Present  Embryo:  Present  Cardiac Activity: Yes  Heart Rate: 165 bpm  CRL:   17 mm  mm   8 w 1 d                  [place] EDC: Jan 23, 2014  Maternal uterus/adnexae: Sonographically unremarkable bilateral ovaries. No free fluid. No subchorionic hemorrhage. Contraction/thickening of the uterine myometrium was noted during the realtime sonographic examination of per the sonographer.  IMPRESSION: 1. Viable single IUP. The estimated gestational age by and crown-rump length is 8 weeks 1 day and the fetal heart rate is 165 beats per min. 2. Contractions/thickening of the uterine myometrium was noted by the sonographer during real-time imaging.   Electronically Signed   By: Malachy Moan M.D.   On: 06/14/2013 23:07    EKG Interpretation   None       MDM   1. Abdominal pain   2. Pyuria    9:25 PM 17 y.o. female who is approximately 9 weeks and 4 days pregnant per her LMP on August 3  presents with right and left lower quadrant pain which began this morning. The patient notes the pain is sharp in nature and intermittent. Her pain is 3/10 on exam currently and she refuses any pain medicine. She is only been pregnant one other time and had an elective abortion. She denies any vaginal bleeding, or vaginal discharge. She does have a history of Chlamydia which was treated in the past. She is afebrile and vital signs are unremarkable here. [facility]. She has not had an ultrasound yet. Labwork is thus far noncontributory. Will get an ultrasound.  11:38 PM: [place] reassuring, pt continues to appear well, abd remains benign. Will have her f/u at health dept where she is currently getting her obgyn care. Few clue cells on wet prep, do not think this is causing her sx and she denies any vag d/c, odor, irritation. Will tx asx pyuria w/ keflex.  I have discussed the diagnosis/risks/treatment options with the patient and believe the pt to be eligible for discharge home to follow-up with the health dept. We also discussed returning to the ED immediately if new or worsening sx occur. We discussed the sx which are most concerning (e.g., worsening pain, fever, vag bleeding) that necessitate immediate return. Any new prescriptions provided to the patient are listed below.  Discharge Medication List as of 06/14/2013 11:40 PM    START taking these medications   Details  cephALEXin (KEFLEX) 500 MG capsule Take 1 capsule (500 mg total) by mouth 2 (two) times daily., Starting 06/14/2013, Until Discontinued, Print         Junius Argyle, MD 06/15/13 [phone]

## 2013-06-14 NOTE — ED Notes (Signed)
MD at bedside. Harrison MD 

## 2013-06-14 NOTE — ED Notes (Signed)
Pt. reports generalized abdominal pain onset this evening , denies nausea ,vomitting or diarrhea , no fever or chills , denies urinary discomfort , no vaginal bleeding or discharge . Pt. stated she is 2 1/2 months pregnant ( G1P0) .

## 2013-06-14 NOTE — ED Notes (Signed)
Remains in ultrasound

## 2013-06-14 NOTE — ED Notes (Signed)
Transport tech arrived for ultrasound transport. MD at bedside for pelvic exam. ED to call ultrasound when ready

## 2013-06-16 LAB — GC/CHLAMYDIA PROBE AMP: GC Probe RNA: NEGATIVE

## 2013-06-16 LAB — URINE CULTURE: Colony Count: >=100000

## 2013-06-17 ENCOUNTER — Telehealth (HOSPITAL_COMMUNITY): Payer: Self-pay | Admitting: Emergency Medicine

## 2013-06-17 NOTE — ED Notes (Signed)
Patient has +Chlamydia. 

## 2013-06-17 NOTE — ED Notes (Signed)
+  Chlamydia. Chart sent to EDP office for review. [facility] attached. 

## 2013-06-21 NOTE — ED Notes (Signed)
Chart returned from EDP office with orders written for Azithromycin 1000 mg po x 1. Patient should f/u with health dept ,GYN

## 2013-06-22 NOTE — ED Notes (Addendum)
Patient informed of positive results after id'd x 2 and informed of need to notify partner to be treated.and reuquts that rx be called to [facility] on [address]

## 2013-07-30 ENCOUNTER — Other Ambulatory Visit (HOSPITAL_COMMUNITY): Payer: Self-pay | Admitting: Nurse Practitioner

## 2013-07-30 DIAGNOSIS — Z3689 Encounter for other specified antenatal screening: Secondary | ICD-10-CM

## 2013-08-20 ENCOUNTER — Encounter (HOSPITAL_COMMUNITY): Payer: Self-pay | Admitting: General Practice

## 2013-08-20 ENCOUNTER — Inpatient Hospital Stay (HOSPITAL_COMMUNITY)
Admission: AD | Admit: 2013-08-20 | Discharge: 2013-08-20 | Disposition: A | Discharge status: Home / Self Care | Payer: Medicaid Other | Source: Ambulatory Visit | Attending: Obstetrics & Gynecology | Admitting: Obstetrics & Gynecology

## 2013-08-20 DIAGNOSIS — O2390 Unspecified genitourinary tract infection in pregnancy, unspecified trimester: Secondary | ICD-10-CM | POA: Insufficient documentation

## 2013-08-20 DIAGNOSIS — O2342 Unspecified infection of urinary tract in pregnancy, second trimester: Secondary | ICD-10-CM

## 2013-08-20 DIAGNOSIS — N949 Unspecified condition associated with female genital organs and menstrual cycle: Secondary | ICD-10-CM

## 2013-08-20 DIAGNOSIS — R109 Unspecified abdominal pain: Secondary | ICD-10-CM | POA: Insufficient documentation

## 2013-08-20 DIAGNOSIS — N390 Urinary tract infection, site not specified: Secondary | ICD-10-CM | POA: Insufficient documentation

## 2013-08-20 LAB — URINALYSIS, ROUTINE W REFLEX MICROSCOPIC
Glucose, UA: NEGATIVE mg/dL
Protein, ur: NEGATIVE mg/dL
Specific Gravity, Urine: 1.025 (ref 1.005–1.030)
pH: 6 (ref 5.0–8.0)

## 2013-08-20 LAB — URINE MICROSCOPIC-ADD ON

## 2013-08-20 MED ORDER — CEPHALEXIN 500 MG PO CAPS: 500.0000 mg | ORAL_CAPSULE | Freq: Four times a day (QID) | ORAL | Status: AC

## 2013-08-20 NOTE — MAU Provider Note (Signed)
Chief Complaint: Abdominal Pain   First Provider Initiated Contact with Patient 08/20/13 1400     SUBJECTIVE HPI: Sarah Hester is a 17 y.o. G2P0010 at [redacted]w[redacted]d pt of [facility] by LMP who presents to maternity admissions reporting sharp bilateral inguinal pain and suprapubic pain so severe she had to leave work today.  He pain increases with movement, like walking, or getting in and out of bed. She reports daily fetal movement, denies LOF, vaginal bleeding, vaginal itching/burning, urinary symptoms, h/a, dizziness, n/v, or fever/chills.   History reviewed. No pertinent past medical history. History reviewed. No pertinent past surgical history. History   Social History  . Marital Status: Single    Spouse Name: N/A    Number of Children: N/A  . Years of Education: N/A   Occupational History  . Not on file.   Social History Main Topics  . Smoking status: Never Smoker   . Smokeless tobacco: Never Used  . Alcohol Use: No  . Drug Use: No  . Sexual Activity: Yes    Birth Control/ Protection: Other-see comments   Other Topics Concern  . Not on file   Social History Narrative  . No narrative on file   No current facility-administered medications on file prior to encounter.   Current Outpatient Prescriptions on File Prior to Encounter  Medication Sig Dispense Refill  . acetaminophen (TYLENOL) 325 MG tablet Take 650 mg by mouth every 6 (six) hours as needed for pain.       No Known Allergies  ROS: Pertinent items in HPI  OBJECTIVE Blood pressure 110/58, pulse 75, temperature 98.1 F (36.7 C), temperature source Oral, resp. rate 18, height 5\' 6"  (1.676 m), weight 65.318 kg (144 lb), last menstrual period 04/08/2013, unknown if currently breastfeeding. GENERAL: Well-developed, well-nourished female in no acute distress.  HEENT: Normocephalic HEART: normal rate RESP: normal effort ABDOMEN: Soft, non-tender EXTREMITIES: Nontender, no edema NEURO: Alert and oriented SPECULUM EXAM:  Deferred  FHT 143 by doppler  Dilation: Closed Effacement (%): Thick Cervical Position: Posterior Station: Ballotable Exam by:: Latasha Buczkowski leftwich-kirby, cnm  LAB RESULTS Results for orders placed during the hospital encounter of 08/20/13 (from the past 24 hour(s))  URINALYSIS, ROUTINE W REFLEX MICROSCOPIC     Status: Abnormal   Collection Time    08/20/13 12:12 PM      Result Value Range   Color, Urine YELLOW  YELLOW   APPearance CLEAR  CLEAR   Specific Gravity, Urine 1.025  1.005 - 1.030   pH 6.0  5.0 - 8.0   Glucose, UA NEGATIVE  NEGATIVE mg/dL   Hgb urine dipstick TRACE (*) NEGATIVE   Bilirubin Urine NEGATIVE  NEGATIVE   Ketones, ur NEGATIVE  NEGATIVE mg/dL   Protein, ur NEGATIVE  NEGATIVE mg/dL   Urobilinogen, UA 0.2  0.0 - 1.0 mg/dL   Nitrite NEGATIVE  NEGATIVE   Leukocytes, UA MODERATE (*) NEGATIVE  URINE MICROSCOPIC-ADD ON     Status: Abnormal   Collection Time    08/20/13 12:12 PM      Result Value Range   Squamous Epithelial / LPF FEW (*) RARE   WBC, UA 7-10  <3 WBC/hpf   RBC / HPF 0-2  <3 RBC/hpf   Bacteria, UA RARE  RARE    ASSESSMENT 1. UTI in pregnancy, antepartum, second trimester   2. Round ligament pain     PLAN Discharge home Rest, ice,  Heat, warm bath, Tylenol for round ligament pain Keflex 500 mg QID x7 days Pt desires  to transfer to [facility] from health dept--message sent to clinic Keep scheduled prenatal appointments for now Return to MAU as needed    Medication List    ASK your doctor about these medications       acetaminophen 325 MG tablet  Commonly known as:  TYLENOL  Take 650 mg by mouth every 6 (six) hours as needed for pain.     prenatal multivitamin Tabs tablet  Take 1 tablet by mouth daily at 12 noon.         Sharen Counter Certified Nurse-Midwife 08/20/2013  2:05 PM

## 2013-08-20 NOTE — MAU Note (Signed)
Pt presents to MAU with c/o lower abdominal pain x 2 days.

## 2013-08-26 ENCOUNTER — Encounter (HOSPITAL_COMMUNITY): Payer: Self-pay | Admitting: *Deleted

## 2013-08-26 ENCOUNTER — Inpatient Hospital Stay (HOSPITAL_COMMUNITY)
Admission: AD | Admit: 2013-08-26 | Discharge: 2013-08-26 | Disposition: A | Discharge status: Home / Self Care | Payer: Medicaid Other | Source: Ambulatory Visit | Attending: Family Medicine | Admitting: Family Medicine

## 2013-08-26 DIAGNOSIS — O2390 Unspecified genitourinary tract infection in pregnancy, unspecified trimester: Secondary | ICD-10-CM | POA: Insufficient documentation

## 2013-08-26 DIAGNOSIS — Z3482 Encounter for supervision of other normal pregnancy, second trimester: Secondary | ICD-10-CM

## 2013-08-26 DIAGNOSIS — J3489 Other specified disorders of nose and nasal sinuses: Secondary | ICD-10-CM | POA: Insufficient documentation

## 2013-08-26 DIAGNOSIS — J069 Acute upper respiratory infection, unspecified: Secondary | ICD-10-CM

## 2013-08-26 DIAGNOSIS — J029 Acute pharyngitis, unspecified: Secondary | ICD-10-CM | POA: Insufficient documentation

## 2013-08-26 DIAGNOSIS — N390 Urinary tract infection, site not specified: Secondary | ICD-10-CM | POA: Insufficient documentation

## 2013-08-26 DIAGNOSIS — R05 Cough: Secondary | ICD-10-CM | POA: Insufficient documentation

## 2013-08-26 DIAGNOSIS — R059 Cough, unspecified: Secondary | ICD-10-CM | POA: Insufficient documentation

## 2013-08-26 DIAGNOSIS — O99891 Other specified diseases and conditions complicating pregnancy: Secondary | ICD-10-CM | POA: Insufficient documentation

## 2013-08-26 HISTORY — DX: Other specified health status: Z78.9

## 2013-08-26 LAB — INFLUENZA PANEL BY PCR (TYPE A & B)
Influenza A By PCR: NEGATIVE
Influenza B By PCR: NEGATIVE

## 2013-08-26 NOTE — MAU Provider Note (Signed)
Chart reviewed and agree with management and plan.  

## 2013-08-26 NOTE — MAU Provider Note (Signed)
  History     CSN: [id number]  Arrival date and time: 08/26/13 2952   First Provider Initiated Contact with Patient 08/26/13 0503      Chief Complaint  Patient presents with  . Sore Throat  . Cough  . Nasal Congestion   Sore Throat  Associated symptoms include coughing.  Cough   Sarah Hester is a 17 y.o. G14P0010 female @ [redacted]w[redacted]d who presents w/ report of nonproductive cough, sore throat, rhinorrhea x 2d. Temp 100.2 this am. Denies ha, ear pain, body aches, cp, sob, uc's, vb, lof. Reports good fm. PNC at [facility], appt 12/29, then transferring to [facility] clinics. Dx w/ uti on 12/15, taking keflex as directed.   OB History   Grav Para Term Preterm Abortions TAB SAB Ect Mult Living   2    1 1           Past Medical History  Diagnosis Date  . Medical history non-contributory     Past Surgical History  Procedure Laterality Date  . No past surgeries      History reviewed. No pertinent family history.  History  Substance Use Topics  . Smoking status: Never Smoker   . Smokeless tobacco: Never Used  . Alcohol Use: No    Allergies: No Known Allergies  Prescriptions prior to admission  Medication Sig Dispense Refill  . acetaminophen (TYLENOL) 325 MG tablet Take 650 mg by mouth every 6 (six) hours as needed for pain.      . cephALEXin (KEFLEX) 500 MG capsule Take 1 capsule (500 mg total) by mouth 4 (four) times daily.  28 capsule  0  . Prenatal Vit-Fe Fumarate-FA (PRENATAL MULTIVITAMIN) TABS tablet Take 1 tablet by mouth daily at 12 noon.      . pseudoephedrine-acetaminophen (TYLENOL SINUS) 30-500 MG TABS Take 2 tablets by mouth every 4 (four) hours as needed.        Review of Systems  Respiratory: Positive for cough.    Pertinent pos/neg as indicated in HPI Physical Exam   Blood pressure 117/59, pulse 97, temperature 98.7 F (37.1 C), temperature source Oral, resp. rate 18, height 5\' 6"  (1.676 m), weight 66.044 kg (145 lb 9.6 oz), last menstrual period 04/08/2013, SpO2  100.00%, unknown if currently breastfeeding.  Physical Exam  Constitutional: She is oriented to person, place, and time. She appears well-developed and well-nourished.  HENT:  Head: Normocephalic.  Mouth/Throat: Oropharynx is clear and moist. No oropharyngeal exudate.  Neck: Normal range of motion.  Cardiovascular: Normal rate and regular rhythm.   Respiratory: Effort normal and breath sounds normal. No respiratory distress. She has no wheezes. She has no rales.  Neurological: She is alert and oriented to person, place, and time.  Skin: Skin is warm and dry.  Psychiatric: She has a normal mood and affect. Her behavior is normal. Judgment and thought content normal.   FHR: 146 via doppler  MAU Course  Procedures  FHR via doppler Flu panel via pcr Assessment and Plan  A:   [redacted]w[redacted]d [facility]  G2P0010   Viral URI  Recent uti on keflex P:  D/C home  Push po fluids, rest, discussed OTC relief measures  Keep appt at [facility] on 12/29 as scheduled, then in Jan at [facility]  Reviewed reasons to return  Continue keflex for uti  Will notify pt & treat accordingly if flu panel returns pos  Marge Duncans 08/26/2013, 5:05 AM

## 2013-08-26 NOTE — MAU Note (Signed)
Pt reports sore throat, nonproductive cough, runny nose x 2 days. Fever of 100.2 this am. Concerned she may have strep throat.

## 2013-08-26 NOTE — MAU Note (Signed)
Genella Rife CNM at the bedside.

## 2013-08-26 NOTE — MAU Note (Signed)
K. Booker CNM notified of pt. 

## 2013-09-03 ENCOUNTER — Ambulatory Visit (HOSPITAL_COMMUNITY)
Admission: RE | Admit: 2013-09-03 | Discharge: 2013-09-03 | Disposition: A | Discharge status: Home / Self Care | Payer: Medicaid Other | Source: Ambulatory Visit | Attending: Nurse Practitioner | Admitting: Nurse Practitioner

## 2013-09-03 ENCOUNTER — Other Ambulatory Visit (HOSPITAL_COMMUNITY): Payer: Self-pay | Admitting: Nurse Practitioner

## 2013-09-03 DIAGNOSIS — Z3689 Encounter for other specified antenatal screening: Secondary | ICD-10-CM

## 2013-09-03 DIAGNOSIS — O3500X0 Maternal care for (suspected) central nervous system malformation or damage in fetus, unspecified, not applicable or unspecified: Secondary | ICD-10-CM | POA: Insufficient documentation

## 2013-09-03 DIAGNOSIS — Z363 Encounter for antenatal screening for malformations: Secondary | ICD-10-CM | POA: Insufficient documentation

## 2013-09-03 DIAGNOSIS — O350XX0 Maternal care for (suspected) central nervous system malformation in fetus, not applicable or unspecified: Secondary | ICD-10-CM | POA: Insufficient documentation

## 2013-09-03 DIAGNOSIS — Z1389 Encounter for screening for other disorder: Secondary | ICD-10-CM | POA: Insufficient documentation

## 2013-09-03 DIAGNOSIS — O358XX0 Maternal care for other (suspected) fetal abnormality and damage, not applicable or unspecified: Secondary | ICD-10-CM | POA: Insufficient documentation

## 2013-09-06 NOTE — L&D Delivery Note (Signed)
Delivery Note At 8:31 AM a viable female was delivered via Vaginal, Spontaneous Delivery (Presentation: ; Occiput Anterior).  APGAR: 9, 9; weight 6 lb 11.4 oz (3045 g).   Placenta status: Intact, Spontaneous.  Cord: 3 vessels with the following complications: None.  Cord pH: none  Anesthesia: None  Episiotomy: None Lacerations: None Suture Repair: none Est. Blood Loss (mL): 350  Mom to postpartum.  Baby to Couplet care / Skin to Skin.  Brock Badharles A Tracie Dore 01/13/2014, 10:53 AM

## 2013-09-12 ENCOUNTER — Encounter: Payer: Self-pay | Admitting: Family Medicine

## 2013-09-18 ENCOUNTER — Ambulatory Visit (HOSPITAL_COMMUNITY): Payer: Self-pay

## 2013-09-24 ENCOUNTER — Ambulatory Visit (HOSPITAL_COMMUNITY): Payer: MEDICAID

## 2013-09-25 ENCOUNTER — Ambulatory Visit (HOSPITAL_COMMUNITY): Payer: Self-pay

## 2013-10-04 LAB — OB RESULTS CONSOLE RUBELLA ANTIBODY, IGM: Rubella: IMMUNE

## 2013-10-04 LAB — OB RESULTS CONSOLE ABO/RH: RH TYPE: POSITIVE

## 2013-10-04 LAB — OB RESULTS CONSOLE GC/CHLAMYDIA
Chlamydia: POSITIVE
Gonorrhea: NEGATIVE

## 2013-10-04 LAB — OB RESULTS CONSOLE ANTIBODY SCREEN: ANTIBODY SCREEN: NEGATIVE

## 2013-10-04 LAB — OB RESULTS CONSOLE RPR: RPR: NONREACTIVE

## 2013-10-04 LAB — OB RESULTS CONSOLE HIV ANTIBODY (ROUTINE TESTING): HIV: NONREACTIVE

## 2013-10-04 LAB — CYTOLOGY - PAP: Pap: NEGATIVE

## 2013-10-04 LAB — OB RESULTS CONSOLE HEPATITIS B SURFACE ANTIGEN: Hepatitis B Surface Ag: NEGATIVE

## 2013-11-29 ENCOUNTER — Encounter (HOSPITAL_COMMUNITY): Payer: Self-pay | Admitting: *Deleted

## 2013-11-29 ENCOUNTER — Inpatient Hospital Stay (HOSPITAL_COMMUNITY)
Admission: AD | Admit: 2013-11-29 | Discharge: 2013-11-30 | Disposition: A | Discharge status: Home / Self Care | Payer: Medicaid Other | Source: Ambulatory Visit | Attending: Obstetrics | Admitting: Obstetrics

## 2013-11-29 DIAGNOSIS — K625 Hemorrhage of anus and rectum: Secondary | ICD-10-CM | POA: Insufficient documentation

## 2013-11-29 DIAGNOSIS — K602 Anal fissure, unspecified: Secondary | ICD-10-CM | POA: Insufficient documentation

## 2013-11-29 DIAGNOSIS — R109 Unspecified abdominal pain: Secondary | ICD-10-CM | POA: Insufficient documentation

## 2013-11-29 DIAGNOSIS — O99891 Other specified diseases and conditions complicating pregnancy: Secondary | ICD-10-CM | POA: Insufficient documentation

## 2013-11-29 DIAGNOSIS — O9989 Other specified diseases and conditions complicating pregnancy, childbirth and the puerperium: Principal | ICD-10-CM

## 2013-11-29 DIAGNOSIS — K6289 Other specified diseases of anus and rectum: Secondary | ICD-10-CM | POA: Insufficient documentation

## 2013-11-29 NOTE — MAU Note (Signed)
Pt states she noticed blood in her stool three days ago and says she is very sore after she uses the bathroom.  She also has pain on her right side.  Denies LOF/VB or cramping.

## 2013-11-30 DIAGNOSIS — K602 Anal fissure, unspecified: Secondary | ICD-10-CM

## 2013-11-30 MED ORDER — DOCUSATE SODIUM 100 MG PO CAPS: 100.0000 mg | ORAL_CAPSULE | Freq: Two times a day (BID) | ORAL | Status: DC | PRN

## 2013-11-30 NOTE — Discharge Instructions (Signed)
Anal Fissure, Adult An anal fissure is a small tear or crack in the skin around the anus. Bleeding from a fissure usually stops on its own within a few minutes. However, bleeding will often reoccur with each bowel movement until the crack heals.  CAUSES   Passing large, hard stools.  Frequent diarrheal stools.  Constipation.  Inflammatory bowel disease (Crohn's disease or ulcerative colitis).  Infections.  Anal sex. SYMPTOMS   Small amounts of blood seen on your stools, on toilet paper, or in the toilet after a bowel movement.  Rectal bleeding.  Painful bowel movements.  Itching or irritation around the anus. DIAGNOSIS Your caregiver will examine the anal area. An anal fissure can usually be seen with careful inspection. A rectal exam may be performed and a short tube (anoscope) may be used to examine the anal canal. TREATMENT   You may be instructed to take fiber supplements. These supplements can soften your stool to help make bowel movements easier.  Sitz baths may be recommended to help heal the tear. Do not use soap in the sitz baths.  A medicated cream or ointment may be prescribed to lessen discomfort. HOME CARE INSTRUCTIONS   Maintain a diet high in fruits, whole grains, and vegetables. Avoid constipating foods like bananas and dairy products.  Take sitz baths as directed by your caregiver.  Drink enough fluids to keep your urine clear or pale yellow.  Only take over-the-counter or prescription medicines for pain, discomfort, or fever as directed by your caregiver. Do not take aspirin as this may increase bleeding.  Do not use ointments containing numbing medications (anesthetics) or hydrocortisone. They could slow healing. SEEK MEDICAL CARE IF:   Your fissure is not completely healed within 3 days.  You have further bleeding.  You have a fever.  You have diarrhea mixed with blood.  You have pain.  Your problem is getting worse rather than  better. MAKE SURE YOU:   Understand these instructions.  Will watch your condition.  Will get help right away if you are not doing well or get worse. Document Released: 08/23/2005 Document Revised: 11/15/2011 Document Reviewed: 02/07/2011 [facility] Patient Information 2014 [place], [place].  Braxton Hicks Contractions Pregnancy is commonly associated with contractions of the uterus throughout the pregnancy. Towards the end of pregnancy (32 to 34 weeks), these contractions [facility] Willa Rough) can develop more often and may become more forceful. This is not true labor because these contractions do not result in opening (dilatation) and thinning of the cervix. They are sometimes difficult to tell apart from true labor because these contractions can be forceful and people have different pain tolerances. You should not feel embarrassed if you go to the hospital with false labor. Sometimes, the only way to tell if you are in true labor is for your caregiver to follow the changes in the cervix. How to tell the difference between true and false labor:  False labor.  The contractions of false labor are usually shorter, irregular and not as hard as those of true labor.  They are often felt in the front of the lower abdomen and in the groin.  They may leave with walking around or changing positions while lying down.  They get weaker and are shorter lasting as time goes on.  These contractions are usually irregular.  They do not usually become progressively stronger, regular and closer together as with true labor.  True labor.  Contractions in true labor last 30 to 70 seconds, become very regular,  usually become more intense, and increase in frequency.  They do not go away with walking.  The discomfort is usually felt in the top of the uterus and spreads to the lower abdomen and low back.  True labor can be determined by your caregiver with an exam. This will show that the cervix is dilating and  getting thinner. If there are no prenatal problems or other health problems associated with the pregnancy, it is completely safe to be sent home with false labor and await the onset of true labor. HOME CARE INSTRUCTIONS   Keep up with your usual exercises and instructions.  Take medications as directed.  Keep your regular prenatal appointment.  Eat and drink lightly if you think you are going into labor.  If BH contractions are making you uncomfortable:  Change your activity position from lying down or resting to walking/walking to resting.  Sit and rest in a tub of warm water.  Drink 2 to 3 glasses of water. Dehydration may cause B-H contractions.  Do slow and deep breathing several times an hour. SEEK IMMEDIATE MEDICAL CARE IF:   Your contractions continue to become stronger, more regular, and closer together.  You have a gushing, burst or leaking of fluid from the vagina.  An oral temperature above 102 F (38.9 C) develops.  You have passage of blood-tinged mucus.  You develop vaginal bleeding.  You develop continuous belly (abdominal) pain.  You have low back pain that you never had before.  You feel the baby's head pushing down causing pelvic pressure.  The baby is not moving as much as it used to. Document Released: 08/23/2005 Document Revised: 11/15/2011 Document Reviewed: 06/04/2013 [facility] Patient Information 2014 [place].

## 2013-11-30 NOTE — MAU Provider Note (Signed)
Chief Complaint: Abdominal Pain and Rectal Bleeding   First Provider Initiated Contact with Patient 11/30/13 0027     SUBJECTIVE HPI: Sarah Hester is a 18 y.o. G2P0010 at [redacted]w[redacted]d by LMP who presents with sharp anal pain and small amount of dark red bleeding with bowel movements x3 days. Last occurrence yesterday. Has had a few episodes of the same symptoms in the past. States she has hemorrhoids. Denies fever, chills, constipation, vaginal bleeding, vaginal discharge, abdominal pain, bloody diarrhea.  Also reports a few episodes of feeling pressure on the side of her uterus over the past few days. Right greater than left. Unsure if she's having contractions.  Past Medical History  Diagnosis Date  . Medical history non-contributory    OB History  Gravida Para Term Preterm AB SAB TAB Ectopic Multiple Living  2    1  1        # Outcome Date GA Lbr Len/2nd Weight Sex Delivery Anes PTL Lv  2 TAB 09/2012          1 GRA              Comments: System Generated. Please review and update pregnancy details.     Past Surgical History  Procedure Laterality Date  . No past surgeries     History   Social History  . Marital Status: Single    Spouse Name: N/A    Number of Children: N/A  . Years of Education: N/A   Occupational History  . Not on file.   Social History Main Topics  . Smoking status: Never Smoker   . Smokeless tobacco: Never Used  . Alcohol Use: No  . Drug Use: No  . Sexual Activity: Yes    Birth Control/ Protection: Other-see comments, None   Other Topics Concern  . Not on file   Social History Narrative  . No narrative on file   No current facility-administered medications on file prior to encounter.   Current Outpatient Prescriptions on File Prior to Encounter  Medication Sig Dispense Refill  . acetaminophen (TYLENOL) 325 MG tablet Take 650 mg by mouth every 6 (six) hours as needed for pain.      . Prenatal Vit-Fe Fumarate-FA (PRENATAL MULTIVITAMIN) TABS tablet  Take 1 tablet by mouth daily at 12 noon.       No Known Allergies  ROS: Pertinent items in HPI  OBJECTIVE Blood pressure 127/63, pulse 79, temperature 98.7 F (37.1 C), temperature source Oral, resp. rate 20, last menstrual period 04/08/2013, SpO2 100.00%. GENERAL: Well-developed, well-nourished female in no acute distress.  HEENT: Normocephalic HEART: normal rate RESP: normal effort ABDOMEN: Soft, non-tender. No CVA tenderness. Positive bowel sounds. EXTREMITIES: Nontender, no edema NEURO: Alert and oriented PELVIC EXAM: NEFG, physiologic discharge, no blood noted. RECTAL EXAM: Possible small healing fissure at 10:00. No hemorrhoids seen. No active bleeding. Dilation: Closed Effacement (%): Thick Cervical Position: Posterior Station: -3 Presentation: Vertex Exam by:: Ivonne Andrew, CNM  EFM: URI 130s, moderate variability, 15 x 15 accelerations. Negative decelerations Toco: None  LAB RESULTS No results found for this or any previous visit (from the past 24 hour(s)).  IMAGING No results found.  MAU COURSE  ASSESSMENT 1. Anal fissure    PLAN Discharge home in stable condition. Preterm labor precautions. Take Colace twice a day and increase fluid and fiber. Do not strain with bowel movements.  Follow-up Information   Follow up with MARSHALL,BERNARD A, MD. (As scheduled or, As needed, If symptoms worsen)  Specialty:  Obstetrics and Gynecology   Contact information:   [address] [place] [address] [phone]       [facility]. (As needed in emergencies)    Contact information:   [address] [id number] [facility] [place] [address] [phone]       Medication List         acetaminophen 325 MG tablet  Commonly known as:  TYLENOL  Take 650 mg by mouth every 6 (six) hours as needed for pain.     docusate sodium 100 MG capsule  Commonly known as:  COLACE  Take 1 capsule  (100 mg total) by mouth 2 (two) times daily as needed.     prenatal multivitamin Tabs tablet  Take 1 tablet by mouth daily at 12 noon.       HortenseVirginia Mckale Haffey, CNM 11/30/2013  12:56 AM

## 2013-12-19 ENCOUNTER — Encounter (HOSPITAL_COMMUNITY): Payer: Self-pay | Admitting: *Deleted

## 2013-12-19 ENCOUNTER — Inpatient Hospital Stay (HOSPITAL_COMMUNITY)
Admission: AD | Admit: 2013-12-19 | Discharge: 2013-12-19 | Disposition: A | Discharge status: Home / Self Care | Payer: Medicaid Other | Source: Ambulatory Visit | Attending: Obstetrics | Admitting: Obstetrics

## 2013-12-19 DIAGNOSIS — O9989 Other specified diseases and conditions complicating pregnancy, childbirth and the puerperium: Secondary | ICD-10-CM

## 2013-12-19 DIAGNOSIS — N898 Other specified noninflammatory disorders of vagina: Secondary | ICD-10-CM

## 2013-12-19 DIAGNOSIS — O4700 False labor before 37 completed weeks of gestation, unspecified trimester: Secondary | ICD-10-CM | POA: Insufficient documentation

## 2013-12-19 DIAGNOSIS — O26893 Other specified pregnancy related conditions, third trimester: Secondary | ICD-10-CM

## 2013-12-19 DIAGNOSIS — O99891 Other specified diseases and conditions complicating pregnancy: Secondary | ICD-10-CM | POA: Insufficient documentation

## 2013-12-19 DIAGNOSIS — R109 Unspecified abdominal pain: Secondary | ICD-10-CM | POA: Insufficient documentation

## 2013-12-19 LAB — POCT FERN TEST: POCT Fern Test: NEGATIVE

## 2013-12-19 NOTE — Discharge Instructions (Signed)
Preterm Labor Information °Preterm labor is when labor starts before you are [redacted] weeks pregnant. The normal length of pregnancy is 39 to 41 weeks.  °CAUSES  °The cause of preterm labor is not often known. The most common known cause is infection. °RISK FACTORS °· Having a history of preterm labor. °· Having your water break before it should. °· Having a placenta that covers the opening of the cervix. °· Having a placenta that breaks away from the uterus. °· Having a cervix that is too weak to hold the baby in the uterus. °· Having too much fluid in the amniotic sac. °· Taking drugs or smoking while pregnant. °· Not gaining enough weight while pregnant. °· Being younger than 18 and older than 20 years old. °· Having a low income. °· Being African American. °SYMPTOMS °· Period-like cramps, belly (abdominal) pain, or back pain. °· Contractions that are regular, as often as six in an hour. They may be mild or painful. °· Contractions that start at the top of the belly. They then move to the lower belly and back. °· Lower belly pressure that seems to get stronger. °· Bleeding from the vagina. °· Fluid leaking from the vagina. °TREATMENT  °Treatment depends on: °· Your condition. °· The condition of your baby. °· How many weeks pregnant you are. °Your doctor may have you: °· Take medicine to stop contractions. °· Stay in bed except to use the restroom (bed rest). °· Stay in the hospital. °WHAT SHOULD YOU DO IF YOU THINK YOU ARE IN PRETERM LABOR? °Call your doctor right away. You need to go to the hospital right away.  °HOW CAN YOU PREVENT PRETERM LABOR IN FUTURE PREGNANCIES? °· Stop smoking, if you smoke. °· Maintain healthy weight gain. °· Do not take drugs or be around chemicals that are not needed. °· Tell your doctor if you think you have an infection. °· Tell your doctor if you had a preterm labor before. °Document Released: 11/19/2008 Document Revised: 06/13/2013 Document Reviewed: 11/19/2008 °ExitCare® Patient  Information ©2014 [employer], LLC. ° °

## 2013-12-19 NOTE — MAU Provider Note (Signed)
History     CSN: [id number]  Arrival date and time: 12/19/13 0009   First Provider Initiated Contact with Patient 12/19/13 [phone]      Chief Complaint  Patient presents with  . Rupture of Membranes  . Abdominal Pain   HPI Sarah Hester is a 18 y.o. G2P0010 at 4450w0d who presents to MAU today with complaint of increased lower abdominal pressure, contractions and LOF. The patient states that she had intercourse at 1030 pm last night and noted around 1100 pm that she was having contractions q 10 minutes and increased lower abdominal pressure and LOF. She denies any gushes of fluid or vaginal bleeding. She reports good fetal movements.   OB History   Grav Para Term Preterm Abortions TAB SAB Ect Mult Living   2    1 1           Past Medical History  Diagnosis Date  . Medical history non-contributory     Past Surgical History  Procedure Laterality Date  . No past surgeries      History reviewed. No pertinent family history.  History  Substance Use Topics  . Smoking status: Never Smoker   . Smokeless tobacco: Never Used  . Alcohol Use: No    Allergies: No Known Allergies  Prescriptions prior to admission  Medication Sig Dispense Refill  . acetaminophen (TYLENOL) 325 MG tablet Take 650 mg by mouth every 6 (six) hours as needed for pain.      . Prenatal Vit-Fe Fumarate-FA (PRENATAL MULTIVITAMIN) TABS tablet Take 1 tablet by mouth daily at 12 noon.      . docusate sodium (COLACE) 100 MG capsule Take 1 capsule (100 mg total) by mouth 2 (two) times daily as needed.  30 capsule  2    Review of Systems  Gastrointestinal: Positive for abdominal pain. Negative for nausea, vomiting, diarrhea and constipation.  Genitourinary: Negative for dysuria, urgency and frequency.       Neg - vaginal bleeding, discharge + LOF   Physical Exam   Blood pressure 115/59, pulse 94, temperature 97.6 F (36.4 C), temperature source Oral, resp. rate 18, height 5\' 6"  (1.676 m), weight 73.483  kg (162 lb), last menstrual period 04/08/2013, SpO2 100.00%.  Physical Exam  Constitutional: She is oriented to person, place, and time. She appears well-developed and well-nourished. No distress.  HENT:  Head: Normocephalic and atraumatic.  Cardiovascular: Normal rate, regular rhythm and normal heart sounds.   Respiratory: Effort normal and breath sounds normal. No respiratory distress.  GI: Soft. Bowel sounds are normal. She exhibits no distension and no mass. There is no tenderness. There is no rebound and no guarding.  Genitourinary: Cervix exhibits no motion tenderness, no discharge and no friability. No bleeding around the vagina. Vaginal discharge (small amount of thin, white, mucus discharge noted) found.  Neurological: She is alert and oriented to person, place, and time.  Skin: Skin is warm and dry. No erythema.  Psychiatric: She has a normal mood and affect.  Dilation: Closed Effacement (%): Thick Cervical Position: Posterior Exam by:: Naaman PlummerJ. Ethier, PA  Results for orders placed during the hospital encounter of 12/19/13 (from the past 24 hour(s))  POCT FERN TEST     Status: None   Collection Time    12/19/13 12:43 AM      Result Value Ref Range   POCT Fern Test Negative = intact amniotic membranes     Fetal Monitoring: Baseline: 120 bpm, moderate variability, + accelerations, no decelerations Contractions: none,  mild UI  MAU Course  Procedures None  MDM RN sample shows sperm and negative fern Speculum exam performed for fern slide - negative fern Assessment and Plan  A: Membrane intact  P: Discharge home Preterm labor precautions discussed Patient advised to follow-up with Dr. Gaynell FaceMarshall as scheduled for routine prenatal care Patient may return to MAU as needed or if her condition were to change or worsen  Freddi StarrJulie N Ethier 12/19/2013, 12:52 AM

## 2013-12-19 NOTE — MAU Note (Signed)
Pt reports leaking fluid since 2300, lower abd pain, and right side pain.

## 2013-12-20 LAB — OB RESULTS CONSOLE GBS: STREP GROUP B AG: NEGATIVE

## 2013-12-20 LAB — OB RESULTS CONSOLE GC/CHLAMYDIA: Chlamydia: NEGATIVE

## 2014-01-07 ENCOUNTER — Inpatient Hospital Stay (HOSPITAL_COMMUNITY)
Admission: AD | Admit: 2014-01-07 | Discharge: 2014-01-07 | Disposition: A | Discharge status: Home / Self Care | Payer: Medicaid Other | Source: Ambulatory Visit | Attending: Obstetrics | Admitting: Obstetrics

## 2014-01-07 ENCOUNTER — Encounter (HOSPITAL_COMMUNITY): Payer: Self-pay | Admitting: *Deleted

## 2014-01-07 DIAGNOSIS — O479 False labor, unspecified: Secondary | ICD-10-CM | POA: Insufficient documentation

## 2014-01-07 LAB — AMNISURE RUPTURE OF MEMBRANE (ROM) NOT AT ARMC: AMNISURE: NEGATIVE

## 2014-01-07 LAB — POCT FERN TEST: POCT FERN TEST: NEGATIVE

## 2014-01-07 MED ORDER — ZOLPIDEM TARTRATE 5 MG PO TABS
5.0000 mg | ORAL_TABLET | Freq: Once | ORAL | Status: AC | PRN
  Administered 2014-01-07: 5 mg via ORAL
  Filled 2014-01-07: qty 1

## 2014-01-07 NOTE — MAU Note (Signed)
Pt reports ROM abour 15 minutes ago, contractions

## 2014-01-07 NOTE — Discharge Instructions (Signed)
Braxton Hicks Contractions Pregnancy is commonly associated with contractions of the uterus throughout the pregnancy. Towards the end of pregnancy (32 to 34 weeks), these contractions (Braxton Hicks) can develop more often and may become more forceful. This is not true labor because these contractions do not result in opening (dilatation) and thinning of the cervix. They are sometimes difficult to tell apart from true labor because these contractions can be forceful and people have different pain tolerances. You should not feel embarrassed if you go to the hospital with false labor. Sometimes, the only way to tell if you are in true labor is for your caregiver to follow the changes in the cervix. How to tell the difference between true and false labor:  False labor.  The contractions of false labor are usually shorter, irregular and not as hard as those of true labor.  They are often felt in the front of the lower abdomen and in the groin.  They may leave with walking around or changing positions while lying down.  They get weaker and are shorter lasting as time goes on.  These contractions are usually irregular.  They do not usually become progressively stronger, regular and closer together as with true labor.  True labor.  Contractions in true labor last 30 to 70 seconds, become very regular, usually become more intense, and increase in frequency.  They do not go away with walking.  The discomfort is usually felt in the top of the uterus and spreads to the lower abdomen and low back.  True labor can be determined by your caregiver with an exam. This will show that the cervix is dilating and getting thinner. If there are no prenatal problems or other health problems associated with the pregnancy, it is completely safe to be sent home with false labor and await the onset of true labor. HOME CARE INSTRUCTIONS   Keep up with your usual exercises and instructions.  Take medications as  directed.  Keep your regular prenatal appointment.  Eat and drink lightly if you think you are going into labor.  If BH contractions are making you uncomfortable:  Change your activity position from lying down or resting to walking/walking to resting.  Sit and rest in a tub of warm water.  Drink 2 to 3 glasses of water. Dehydration may cause B-H contractions.  Do slow and deep breathing several times an hour. SEEK IMMEDIATE MEDICAL CARE IF:   Your contractions continue to become stronger, more regular, and closer together.  You have a gushing, burst or leaking of fluid from the vagina.  An oral temperature above 102 F (38.9 C) develops.  You have passage of blood-tinged mucus.  You develop vaginal bleeding.  You develop continuous belly (abdominal) pain.  You have low back pain that you never had before.  You feel the baby's head pushing down causing pelvic pressure.  The baby is not moving as much as it used to. Document Released: 08/23/2005 Document Revised: 11/15/2011 Document Reviewed: 06/04/2013 [employer] Patient Information 2014 [employer]  Fetal Movement Counts Patient Name: __________________________________________________ Patient Due Date: ____________________ Performing a fetal movement count is highly recommended in high-risk pregnancies, but it is good for every pregnant woman to do. Your caregiver may ask you to start counting fetal movements at 28 weeks of the pregnancy. Fetal movements often increase:  After eating a full meal.  After physical activity.  After eating or drinking something sweet or cold.  At rest. Pay attention to when you feel   the baby is most active. This will help you notice a pattern of your baby's sleep and wake cycles and what factors contribute to an increase in fetal movement. It is important to perform a fetal movement count at the same time each day when your baby is normally most active.  HOW TO COUNT FETAL  MOVEMENTS 1. Find a quiet and comfortable area to sit or lie down on your left side. Lying on your left side provides the best blood and oxygen circulation to your baby. 2. Write down the day and time on a sheet of paper or in a journal. 3. Start counting kicks, flutters, swishes, rolls, or jabs in a 2 hour period. You should feel at least 10 movements within 2 hours. 4. If you do not feel 10 movements in 2 hours, wait 2 3 hours and count again. Look for a change in the pattern or not enough counts in 2 hours. SEEK MEDICAL CARE IF:  You feel less than 10 counts in 2 hours, tried twice.  There is no movement in over an hour.  The pattern is changing or taking longer each day to reach 10 counts in 2 hours.  You feel the baby is not moving as he or she usually does. Date: ____________ Movements: ____________ Start time: ____________ Finish time: ____________  Date: ____________ Movements: ____________ Start time: ____________ Finish time: ____________ Date: ____________ Movements: ____________ Start time: ____________ Finish time: ____________ Date: ____________ Movements: ____________ Start time: ____________ Finish time: ____________ Date: ____________ Movements: ____________ Start time: ____________ Finish time: ____________ Date: ____________ Movements: ____________ Start time: ____________ Finish time: ____________ Date: ____________ Movements: ____________ Start time: ____________ Finish time: ____________ Date: ____________ Movements: ____________ Start time: ____________ Finish time: ____________  Date: ____________ Movements: ____________ Start time: ____________ Finish time: ____________ Date: ____________ Movements: ____________ Start time: ____________ Finish time: ____________ Date: ____________ Movements: ____________ Start time: ____________ Finish time: ____________ Date: ____________ Movements: ____________ Start time: ____________ Finish time: ____________ Date: ____________  Movements: ____________ Start time: ____________ Finish time: ____________ Date: ____________ Movements: ____________ Start time: ____________ Finish time: ____________ Date: ____________ Movements: ____________ Start time: ____________ Finish time: ____________  Date: ____________ Movements: ____________ Start time: ____________ Finish time: ____________ Date: ____________ Movements: ____________ Start time: ____________ Finish time: ____________ Date: ____________ Movements: ____________ Start time: ____________ Finish time: ____________ Date: ____________ Movements: ____________ Start time: ____________ Finish time: ____________ Date: ____________ Movements: ____________ Start time: ____________ Finish time: ____________ Date: ____________ Movements: ____________ Start time: ____________ Finish time: ____________ Date: ____________ Movements: ____________ Start time: ____________ Finish time: ____________  Date: ____________ Movements: ____________ Start time: ____________ Finish time: ____________ Date: ____________ Movements: ____________ Start time: ____________ Finish time: ____________ Date: ____________ Movements: ____________ Start time: ____________ Finish time: ____________ Date: ____________ Movements: ____________ Start time: ____________ Finish time: ____________ Date: ____________ Movements: ____________ Start time: ____________ Finish time: ____________ Date: ____________ Movements: ____________ Start time: ____________ Finish time: ____________ Date: ____________ Movements: ____________ Start time: ____________ Finish time: ____________  Date: ____________ Movements: ____________ Start time: ____________ Finish time: ____________ Date: ____________ Movements: ____________ Start time: ____________ Finish time: ____________ Date: ____________ Movements: ____________ Start time: ____________ Finish time: ____________ Date: ____________ Movements: ____________ Start time:  ____________ Finish time: ____________ Date: ____________ Movements: ____________ Start time: ____________ Finish time: ____________ Date: ____________ Movements: ____________ Start time: ____________ Finish time: ____________ Date: ____________ Movements: ____________ Start time: ____________ Finish time: ____________  Date: ____________ Movements: ____________ Start time: ____________ Finish time: ____________ Date: ____________ Movements: ____________ Start   time: ____________ Finish time: ____________ Date: ____________ Movements: ____________ Start time: ____________ Finish time: ____________ Date: ____________ Movements: ____________ Start time: ____________ Finish time: ____________ Date: ____________ Movements: ____________ Start time: ____________ Finish time: ____________ Date: ____________ Movements: ____________ Start time: ____________ Finish time: ____________ Date: ____________ Movements: ____________ Start time: ____________ Finish time: ____________  Date: ____________ Movements: ____________ Start time: ____________ Finish time: ____________ Date: ____________ Movements: ____________ Start time: ____________ Finish time: ____________ Date: ____________ Movements: ____________ Start time: ____________ Finish time: ____________ Date: ____________ Movements: ____________ Start time: ____________ Finish time: ____________ Date: ____________ Movements: ____________ Start time: ____________ Finish time: ____________ Date: ____________ Movements: ____________ Start time: ____________ Finish time: ____________ Date: ____________ Movements: ____________ Start time: ____________ Finish time: ____________  Date: ____________ Movements: ____________ Start time: ____________ Finish time: ____________ Date: ____________ Movements: ____________ Start time: ____________ Finish time: ____________ Date: ____________ Movements: ____________ Start time: ____________ Finish time: ____________ Date:  ____________ Movements: ____________ Start time: ____________ Finish time: ____________ Date: ____________ Movements: ____________ Start time: ____________ Finish time: ____________ Date: ____________ Movements: ____________ Start time: ____________ Finish time: ____________ Document Released: 09/22/2006 Document Revised: 08/09/2012 Document Reviewed: 06/19/2012 [employer] Patient Information 2014 [employer]  

## 2014-01-08 ENCOUNTER — Inpatient Hospital Stay (HOSPITAL_COMMUNITY)
Admission: AD | Admit: 2014-01-08 | Discharge: 2014-01-08 | Disposition: A | Discharge status: Home / Self Care | Payer: Medicaid Other | Source: Ambulatory Visit | Attending: Obstetrics | Admitting: Obstetrics

## 2014-01-08 ENCOUNTER — Encounter (HOSPITAL_COMMUNITY): Payer: Self-pay | Admitting: *Deleted

## 2014-01-08 DIAGNOSIS — O479 False labor, unspecified: Secondary | ICD-10-CM | POA: Insufficient documentation

## 2014-01-08 MED ORDER — OXYCODONE-ACETAMINOPHEN 5-325 MG PO TABS
1.0000 | ORAL_TABLET | ORAL | Status: DC | PRN
Start: 2014-01-08 — End: 2014-01-15

## 2014-01-08 MED ORDER — OXYCODONE-ACETAMINOPHEN 5-325 MG PO TABS
2.0000 | ORAL_TABLET | Freq: Once | ORAL | Status: AC
  Administered 2014-01-08: 2 via ORAL
  Filled 2014-01-08: qty 2

## 2014-01-08 NOTE — [facility] Provider Note (Signed)
Patient is here for term labor evaluation Seen by RN. RN called Dr. Gaynell FaceMarshall to inform him of cervical exam, fetal tracing and cervical exam that has been unchanged Dr. Gaynell FaceMarshall asked NP to write an RX for patient to go home with.  RX: Percocet #15 no refill  Iona HansenJennifer Irene Rasch, NP 01/08/2014 11:01 AM

## 2014-01-08 NOTE — Discharge Instructions (Signed)
Pregnancy, The Father's Role A father has an important role during their partners pregnancy, labor, delivery and afterward. It is important to help and support your partner through this new period. There are many physical and emotional changes that happen. To be helpful and supportive during this time, you should know and understand what is happening to your partner during the pregnancy, labor, delivery and postpartum period.  PREGNANCY Pregnancy lasts 40 weeks (plus or minus 2 weeks). The pregnancy is divided into three trimesters.   In the first 13 weeks, the mother feels tired, has painful breasts, may feel sick to her stomach (nauseated), throw up (vomit), urinates more often and may have mood changes. All of these changes are normal. If the father is aware of these, he can be more helpful, supportive and understanding. This may include helping with household duties and activities and spending more time with each other.  In the next 14 to 28 weeks, your partner is over the tiredness, nausea and vomiting. She will likely feel better and more energetic. This is the best time of the pregnancy to be more active together, go out more often or take trips. You will be able to see her belly popping out with the pregnancy. You may be able to feel the baby kick.  In the last 12 weeks, she may become more uncomfortable again because her abdomen is popping out more as the baby grows. She may have a hard time doing household chores, her balance may be off, she may have a hard time bending over, tires easily and has a tough time sleeping. At this time, you will realize the birth of your baby is close. You and your partner may have concerns about the safety of your partner and if the baby will be normal and healthy. These are all normal and natural feelings. You should talk with each other and your caregiver if you have any questions. Attend prenatal care visits with your partner. This is a good time for you to get  to know your caregiver, follow the pregnancy and ask questions. Prenatal visits are once a month for 6 months, then they are every 2 weeks for 2 months and then once a week the last month. You may have more prenatal visits if your caregiver feels it is needed. Your caregiver usually does an ultrasound of the baby at one of the prenatal visits or more often if needed. It is an exciting and emotional to see the baby moving and the heart beating.  Fathers can experience emotional changes during this time as well. These emotions can include happiness, excitement and feeling proud. Fathers may also be concerned about having new responsibilities. These include financial, educational and if it will change the relationship with his partner. These feelings are normal. They should be talked about openly and positively with each other. An important and often asked question is if sexual intercourse is safe during pregnancy and if it will harm the baby. Sexual intercourse is safe unless there is a problem with the pregnancy and your caregiver advises you to not have sexual intercourse. Because physical and emotional changes happen in pregnancy, your partner may not want to have sex during certain times. This is mostly true in the first and third trimesters. Trying different positions may make sexual intercourse comfortable. It is important for the both of you to discuss your feelings and desires with this problem. Talk to your caregiver about any questions you have about sexual intercourse during the  pregnancy. LABOR AND DELIVERY There are childbirth classes available for couples to take together. They help you understand what happens during labor and delivery. They also teach you how to help your partner with her labor pains, how to relax, breath properly during a contraction and focus on what is happening during labor. You may be asked to time the contractions, massage her back and breath with her during the contractions.  You are also there to see and enjoy the excitement your baby being born. If you have any feelings of fainting or are uncomfortable, tell someone to help you. You may be asked to leave the room if a problem develops during the labor or delivery. Sometimes a Cesarean Section (C-section) is scheduled or is an emergency during labor and delivery. A C-section is a major operation to deliver the baby. It is done through an incision in the abdomen and uterus. Your partner will be given a medicine to make her sleep (general anesthesia) or spinal anesthesia (numbing the body from the waist down). Most hospitals allow the father in the room for a C-section unless it is an emergency. Recovery from a C-section takes longer, is more uncomfortable and will require more help from the father. AFTER DELIVERY After the baby is born, the mother goes through many changes again. These changes could last 4 to 6 weeks or longer following a C-section. It is not unusual to be anxious, concerned and afraid that you may not be taking care of your newborn baby properly. Your partner may take a while to regain her strength. She may also get feelings of sadness (postpartum blues or depression), which is a more serious condition that may require medical treatment.  Your partner may decide to breastfeed the baby. This helps with bonding between the mother and the baby. Breastfeeding is the best way to feed the baby, but you may feel "left out." However, you can feel included by burping the baby and bottle feeding the baby with breast milk (collected by the mother) to give your partner some rest. This also helps you to bond with the baby. Breastfeeding mothers can get pregnant even if they are not having menstrual periods. Therefore, some form of birth control should be used if you do not want to get pregnant. Another question and concern is when it is safe to have sexual intercourse again. Usually it takes 4 to 6 weeks for healing to be over  with. It may take longer after a C-section. If you have any questions about having sexual intercourse or if it is painful, talk to your caregiver. As a father, you will be adjusting your role as the baby grows. Fatherhood is a on-going [employer]. You and your partner should still make time to be together alone and be the couple you were before the baby was born. This is helpful for you, your partner and your baby. As you can see, it is important for a father to be helpful, understanding and supportive during this special time. Document Released: 02/09/2008 Document Revised: 11/15/2011 Document Reviewed: 02/09/2008 [facility] Patient Information 2014 [place], [place]. Fetal Movement Counts Patient Name: __________________________________________________ Patient Due Date: ____________________ Performing a fetal movement count is highly recommended in high-risk pregnancies, but it is good for every pregnant woman to do. Your caregiver may ask you to start counting fetal movements at 28 weeks of the pregnancy. Fetal movements often increase:  After eating a full meal.  After physical activity.  After eating or drinking something sweet or  cold.  At rest. Pay attention to when you feel the baby is most active. This will help you notice a pattern of your baby's sleep and wake cycles and what factors contribute to an increase in fetal movement. It is important to perform a fetal movement count at the same time each day when your baby is normally most active.  HOW TO COUNT FETAL MOVEMENTS 1. Find a quiet and comfortable area to sit or lie down on your left side. Lying on your left side provides the best blood and oxygen circulation to your baby. 2. Write down the day and time on a sheet of paper or in a journal. 3. Start counting kicks, flutters, swishes, rolls, or jabs in a 2 hour period. You should feel at least 10 movements within 2 hours. 4. If you do not feel 10 movements in 2 hours, wait 2 3  hours and count again. Look for a change in the pattern or not enough counts in 2 hours. SEEK MEDICAL CARE IF:  You feel less than 10 counts in 2 hours, tried twice.  There is no movement in over an hour.  The pattern is changing or taking longer each day to reach 10 counts in 2 hours.  You feel the baby is not moving as he or she usually does. Date: ____________ Movements: ____________ Start time: ____________ Doreatha MartinFinish time: ____________  Date: ____________ Movements: ____________ Start time: ____________ Doreatha MartinFinish time: ____________ Date: ____________ Movements: ____________ Start time: ____________ Doreatha MartinFinish time: ____________ Date: ____________ Movements: ____________ Start time: ____________ Doreatha MartinFinish time: ____________ Date: ____________ Movements: ____________ Start time: ____________ Doreatha MartinFinish time: ____________ Date: ____________ Movements: ____________ Start time: ____________ Doreatha MartinFinish time: ____________ Date: ____________ Movements: ____________ Start time: ____________ Doreatha MartinFinish time: ____________ Date: ____________ Movements: ____________ Start time: ____________ Doreatha MartinFinish time: ____________  Date: ____________ Movements: ____________ Start time: ____________ Doreatha MartinFinish time: ____________ Date: ____________ Movements: ____________ Start time: ____________ Doreatha MartinFinish time: ____________ Date: ____________ Movements: ____________ Start time: ____________ Doreatha MartinFinish time: ____________ Date: ____________ Movements: ____________ Start time: ____________ Doreatha MartinFinish time: ____________ Date: ____________ Movements: ____________ Start time: ____________ Doreatha MartinFinish time: ____________ Date: ____________ Movements: ____________ Start time: ____________ Doreatha MartinFinish time: ____________ Date: ____________ Movements: ____________ Start time: ____________ Doreatha MartinFinish time: ____________  Date: ____________ Movements: ____________ Start time: ____________ Doreatha MartinFinish time: ____________ Date: ____________ Movements: ____________ Start time: ____________  Doreatha MartinFinish time: ____________ Date: ____________ Movements: ____________ Start time: ____________ Doreatha MartinFinish time: ____________ Date: ____________ Movements: ____________ Start time: ____________ Doreatha MartinFinish time: ____________ Date: ____________ Movements: ____________ Start time: ____________ Doreatha MartinFinish time: ____________ Date: ____________ Movements: ____________ Start time: ____________ Doreatha MartinFinish time: ____________ Date: ____________ Movements: ____________ Start time: ____________ Doreatha MartinFinish time: ____________  Date: ____________ Movements: ____________ Start time: ____________ Doreatha MartinFinish time: ____________ Date: ____________ Movements: ____________ Start time: ____________ Doreatha MartinFinish time: ____________ Date: ____________ Movements: ____________ Start time: ____________ Doreatha MartinFinish time: ____________ Date: ____________ Movements: ____________ Start time: ____________ Doreatha MartinFinish time: ____________ Date: ____________ Movements: ____________ Start time: ____________ Doreatha MartinFinish time: ____________ Date: ____________ Movements: ____________ Start time: ____________ Doreatha MartinFinish time: ____________ Date: ____________ Movements: ____________ Start time: ____________ Doreatha MartinFinish time: ____________  Date: ____________ Movements: ____________ Start time: ____________ Doreatha MartinFinish time: ____________ Date: ____________ Movements: ____________ Start time: ____________ Doreatha MartinFinish time: ____________ Date: ____________ Movements: ____________ Start time: ____________ Doreatha MartinFinish time: ____________ Date: ____________ Movements: ____________ Start time: ____________ Doreatha MartinFinish time: ____________ Date: ____________ Movements: ____________ Start time: ____________ Doreatha MartinFinish time: ____________ Date: ____________ Movements: ____________ Start time: ____________ Doreatha MartinFinish time: ____________ Date: ____________ Movements: ____________ Start time: ____________ Doreatha MartinFinish time: ____________  Date: ____________ Movements: ____________ Start time:  ____________ Doreatha Martin time: ____________ Date: ____________  Movements: ____________ Start time: ____________ Doreatha Martin time: ____________ Date: ____________ Movements: ____________ Start time: ____________ Doreatha Martin time: ____________ Date: ____________ Movements: ____________ Start time: ____________ Doreatha Martin time: ____________ Date: ____________ Movements: ____________ Start time: ____________ Doreatha Martin time: ____________ Date: ____________ Movements: ____________ Start time: ____________ Doreatha Martin time: ____________ Date: ____________ Movements: ____________ Start time: ____________ Doreatha Martin time: ____________  Date: ____________ Movements: ____________ Start time: ____________ Doreatha Martin time: ____________ Date: ____________ Movements: ____________ Start time: ____________ Doreatha Martin time: ____________ Date: ____________ Movements: ____________ Start time: ____________ Doreatha Martin time: ____________ Date: ____________ Movements: ____________ Start time: ____________ Doreatha Martin time: ____________ Date: ____________ Movements: ____________ Start time: ____________ Doreatha Martin time: ____________ Date: ____________ Movements: ____________ Start time: ____________ Doreatha Martin time: ____________ Date: ____________ Movements: ____________ Start time: ____________ Doreatha Martin time: ____________  Date: ____________ Movements: ____________ Start time: ____________ Doreatha Martin time: ____________ Date: ____________ Movements: ____________ Start time: ____________ Doreatha Martin time: ____________ Date: ____________ Movements: ____________ Start time: ____________ Doreatha Martin time: ____________ Date: ____________ Movements: ____________ Start time: ____________ Doreatha Martin time: ____________ Date: ____________ Movements: ____________ Start time: ____________ Doreatha Martin time: ____________ Date: ____________ Movements: ____________ Start time: ____________ Doreatha Martin time: ____________ Document Released: 09/22/2006 Document Revised: 08/09/2012 Document Reviewed: 06/19/2012 [employer] Patient Information 2014 [employer] Braxton Hicks  Contractions Pregnancy is commonly associated with contractions of the uterus throughout the pregnancy. Towards the end of pregnancy (32 to 34 weeks), these contractions [facility] Willa Rough) can develop more often and may become more forceful. This is not true labor because these contractions do not result in opening (dilatation) and thinning of the cervix. They are sometimes difficult to tell apart from true labor because these contractions can be forceful and people have different pain tolerances. You should not feel embarrassed if you go to the hospital with false labor. Sometimes, the only way to tell if you are in true labor is for your caregiver to follow the changes in the cervix. How to tell the difference between true and false labor:  False labor.  The contractions of false labor are usually shorter, irregular and not as hard as those of true labor.  They are often felt in the front of the lower abdomen and in the groin.  They may leave with walking around or changing positions while lying down.  They get weaker and are shorter lasting as time goes on.  These contractions are usually irregular.  They do not usually become progressively stronger, regular and closer together as with true labor.  True labor.  Contractions in true labor last 30 to 70 seconds, become very regular, usually become more intense, and increase in frequency.  They do not go away with walking.  The discomfort is usually felt in the top of the uterus and spreads to the lower abdomen and low back.  True labor can be determined by your caregiver with an exam. This will show that the cervix is dilating and getting thinner. If there are no prenatal problems or other health problems associated with the pregnancy, it is completely safe to be sent home with false labor and await the onset of true labor. HOME CARE INSTRUCTIONS   Keep up with your usual exercises and instructions.  Take medications as  directed.  Keep your regular prenatal appointment.  Eat and drink lightly if you think you are going into labor.  If BH contractions are making you uncomfortable:  Change your activity position from lying down or resting to walking/walking to resting.  Sit and rest in a tub of warm  water.  Drink 2 to 3 glasses of water. Dehydration may cause B-H contractions.  Do slow and deep breathing several times an hour. SEEK IMMEDIATE MEDICAL CARE IF:   Your contractions continue to become stronger, more regular, and closer together.  You have a gushing, burst or leaking of fluid from the vagina.  An oral temperature above 102 F (38.9 C) develops.  You have passage of blood-tinged mucus.  You develop vaginal bleeding.  You develop continuous belly (abdominal) pain.  You have low back pain that you never had before.  You feel the baby's head pushing down causing pelvic pressure.  The baby is not moving as much as it used to. Document Released: 08/23/2005 Document Revised: 11/15/2011 Document Reviewed: 06/04/2013 [facility] Patient Information 2014 [place]

## 2014-01-08 NOTE — MAU Note (Signed)
Was here during the night, contractions closer and stronger.  Denies any leaking or bleeding.

## 2014-01-08 NOTE — MAU Note (Addendum)
Pt reports uc's since waking up at 0700. Denies LOF or bleeding

## 2014-01-12 ENCOUNTER — Inpatient Hospital Stay (HOSPITAL_COMMUNITY)
Admission: AD | Admit: 2014-01-12 | Discharge: 2014-01-12 | Disposition: A | Discharge status: Home / Self Care | Payer: Medicaid Other | Source: Ambulatory Visit | Attending: Obstetrics | Admitting: Obstetrics

## 2014-01-12 ENCOUNTER — Encounter (HOSPITAL_COMMUNITY): Payer: Self-pay | Admitting: *Deleted

## 2014-01-12 DIAGNOSIS — O479 False labor, unspecified: Secondary | ICD-10-CM | POA: Insufficient documentation

## 2014-01-12 NOTE — MAU Note (Signed)
Contractions since 1630. Leaked alittle fld at 1630 but none since

## 2014-01-12 NOTE — Progress Notes (Signed)
Written and verbal d/c instructions given and understanding voiced. Pt offered med to help her rest. Has percocet at home but does not like the way it makes her feel. May try Tylenol PM or Benedryl

## 2014-01-13 ENCOUNTER — Inpatient Hospital Stay (HOSPITAL_COMMUNITY)
Admission: AD | Admit: 2014-01-13 | Discharge: 2014-01-15 | DRG: 775 | Disposition: A | Discharge status: Home / Self Care | Payer: Medicaid Other | Source: Ambulatory Visit | Attending: Obstetrics | Admitting: Obstetrics

## 2014-01-13 ENCOUNTER — Inpatient Hospital Stay (HOSPITAL_COMMUNITY)
Admission: AD | Admit: 2014-01-13 | Discharge: 2014-01-13 | Disposition: A | Discharge status: Home / Self Care | Payer: Medicaid Other | Source: Ambulatory Visit | Attending: Obstetrics | Admitting: Obstetrics

## 2014-01-13 ENCOUNTER — Encounter (HOSPITAL_COMMUNITY): Payer: Self-pay | Admitting: *Deleted

## 2014-01-13 LAB — CBC
HCT: 33.9 % — ABNORMAL LOW (ref 36.0–46.0)
Hemoglobin: 12.2 g/dL (ref 12.0–15.0)
MCH: 32 pg (ref 26.0–34.0)
MCHC: 36 g/dL (ref 30.0–36.0)
MCV: 89 fL (ref 78.0–100.0)
PLATELETS: 192 10*3/uL (ref 150–400)
RBC: 3.81 MIL/uL — ABNORMAL LOW (ref 3.87–5.11)
RDW: 12.4 % (ref 11.5–15.5)
WBC: 12.7 10*3/uL — ABNORMAL HIGH (ref 4.0–10.5)

## 2014-01-13 LAB — RPR

## 2014-01-13 MED ORDER — NALBUPHINE HCL 10 MG/ML IJ SOLN
10.0000 mg | Freq: Once | INTRAMUSCULAR | Status: AC
  Administered 2014-01-13: 10 mg via INTRAMUSCULAR
  Filled 2014-01-13: qty 1

## 2014-01-13 MED ORDER — OXYTOCIN 10 UNIT/ML IJ SOLN
INTRAMUSCULAR | Status: AC
  Administered 2014-01-13: 200835 [IU]
  Filled 2014-01-13: qty 2

## 2014-01-13 MED ORDER — LACTATED RINGERS IV SOLN: INTRAVENOUS | Status: DC

## 2014-01-13 MED ORDER — SIMETHICONE 80 MG PO CHEW: 80.0000 mg | CHEWABLE_TABLET | ORAL | Status: DC | PRN

## 2014-01-13 MED ORDER — ONDANSETRON HCL 4 MG/2ML IJ SOLN: 4.0000 mg | INTRAMUSCULAR | Status: DC | PRN

## 2014-01-13 MED ORDER — DIBUCAINE 1 % RE OINT: 1.0000 "application " | TOPICAL_OINTMENT | RECTAL | Status: DC | PRN

## 2014-01-13 MED ORDER — LACTATED RINGERS IV SOLN: 500.0000 mL | INTRAVENOUS | Status: DC | PRN

## 2014-01-13 MED ORDER — OXYTOCIN 40 UNITS IN LACTATED RINGERS INFUSION - SIMPLE MED
62.5000 mL/h | INTRAVENOUS | Status: DC
Start: 2014-01-13 — End: 2014-01-13

## 2014-01-13 MED ORDER — IBUPROFEN 600 MG PO TABS
600.0000 mg | ORAL_TABLET | Freq: Four times a day (QID) | ORAL | Status: DC | PRN
  Administered 2014-01-13: 600 mg via ORAL
  Filled 2014-01-13: qty 1

## 2014-01-13 MED ORDER — WITCH HAZEL-GLYCERIN EX PADS: 1.0000 "application " | MEDICATED_PAD | CUTANEOUS | Status: DC | PRN

## 2014-01-13 MED ORDER — ONDANSETRON HCL 4 MG PO TABS: 4.0000 mg | ORAL_TABLET | ORAL | Status: DC | PRN

## 2014-01-13 MED ORDER — TETANUS-DIPHTH-ACELL PERTUSSIS 5-2.5-18.5 LF-MCG/0.5 IM SUSP
0.5000 mL | Freq: Once | INTRAMUSCULAR | Status: AC
  Administered 2014-01-14: 0.5 mL via INTRAMUSCULAR
  Filled 2014-01-13: qty 0.5

## 2014-01-13 MED ORDER — MEDROXYPROGESTERONE ACETATE 150 MG/ML IM SUSP: 150.0000 mg | INTRAMUSCULAR | Status: DC | PRN

## 2014-01-13 MED ORDER — BENZOCAINE-MENTHOL 20-0.5 % EX AERO
1.0000 "application " | INHALATION_SPRAY | CUTANEOUS | Status: DC | PRN
  Administered 2014-01-13: 1 via TOPICAL
  Filled 2014-01-13: qty 56

## 2014-01-13 MED ORDER — IBUPROFEN 600 MG PO TABS
600.0000 mg | ORAL_TABLET | Freq: Four times a day (QID) | ORAL | Status: DC
  Administered 2014-01-13 – 2014-01-15 (×7): 600 mg via ORAL
  Filled 2014-01-13 (×7): qty 1

## 2014-01-13 MED ORDER — DIPHENHYDRAMINE HCL 25 MG PO CAPS: 25.0000 mg | ORAL_CAPSULE | Freq: Four times a day (QID) | ORAL | Status: DC | PRN

## 2014-01-13 MED ORDER — LIDOCAINE HCL (PF) 1 % IJ SOLN
30.0000 mL | INTRAMUSCULAR | Status: DC | PRN
  Filled 2014-01-13: qty 30

## 2014-01-13 MED ORDER — SENNOSIDES-DOCUSATE SODIUM 8.6-50 MG PO TABS
2.0000 | ORAL_TABLET | ORAL | Status: DC
  Administered 2014-01-14 – 2014-01-15 (×2): 2 via ORAL
  Filled 2014-01-13 (×2): qty 2

## 2014-01-13 MED ORDER — PRENATAL MULTIVITAMIN CH
1.0000 | ORAL_TABLET | Freq: Every day | ORAL | Status: DC
  Administered 2014-01-13 – 2014-01-14 (×2): 1 via ORAL
  Filled 2014-01-13 (×2): qty 1

## 2014-01-13 MED ORDER — ONDANSETRON HCL 4 MG/2ML IJ SOLN: 4.0000 mg | Freq: Four times a day (QID) | INTRAMUSCULAR | Status: DC | PRN

## 2014-01-13 MED ORDER — ZOLPIDEM TARTRATE 5 MG PO TABS: 5.0000 mg | ORAL_TABLET | Freq: Every evening | ORAL | Status: DC | PRN

## 2014-01-13 MED ORDER — OXYCODONE-ACETAMINOPHEN 5-325 MG PO TABS: 1.0000 | ORAL_TABLET | ORAL | Status: DC | PRN

## 2014-01-13 MED ORDER — HYDROMORPHONE HCL 2 MG PO TABS: 4.0000 mg | ORAL_TABLET | Freq: Once | ORAL | Status: DC

## 2014-01-13 MED ORDER — OXYTOCIN BOLUS FROM INFUSION
500.0000 mL | INTRAVENOUS | Status: DC
Start: 2014-01-13 — End: 2014-01-13

## 2014-01-13 MED ORDER — LANOLIN HYDROUS EX OINT: TOPICAL_OINTMENT | CUTANEOUS | Status: DC | PRN

## 2014-01-13 MED ORDER — CITRIC ACID-SODIUM CITRATE 334-500 MG/5ML PO SOLN: 30.0000 mL | ORAL | Status: DC | PRN

## 2014-01-13 MED ORDER — ACETAMINOPHEN 325 MG PO TABS: 650.0000 mg | ORAL_TABLET | ORAL | Status: DC | PRN

## 2014-01-13 MED ORDER — PROMETHAZINE HCL 25 MG/ML IJ SOLN
12.5000 mg | Freq: Once | INTRAMUSCULAR | Status: AC
  Administered 2014-01-13: 12.5 mg via INTRAMUSCULAR
  Filled 2014-01-13: qty 1

## 2014-01-13 NOTE — MAU Note (Signed)
Contractions evert 3-4 minutes, closer and stronger than earlier.

## 2014-01-13 NOTE — H&P (Signed)
Sarah BurdockJessica Godar is a 18 y.o. female presenting for UC's. Maternal Medical History:  Reason for admission: Contractions.   Contractions: Onset was yesterday.    Fetal activity: Perceived fetal activity is normal.    Prenatal complications: no prenatal complications Prenatal Complications - Diabetes: none.    OB History   Grav Para Term Preterm Abortions TAB SAB Ect Mult Living   2 1 1  1 1    1      Past Medical History  Diagnosis Date  . Medical history non-contributory    Past Surgical History  Procedure Laterality Date  . No past surgeries     Family History: family history includes Diabetes in her maternal grandmother. There is no history of Heart disease, Hypertension, Stroke, Mental illness, Drug abuse, or Depression. Social History:  reports that she has never smoked. She has never used smokeless tobacco. She reports that she does not drink alcohol or use illicit drugs.   Prenatal Transfer Tool  Maternal Diabetes: No Genetic Screening: Normal Maternal Ultrasounds/Referrals: Normal Fetal Ultrasounds or other Referrals:  None Maternal Substance Abuse:  No Significant Maternal Medications:  None Significant Maternal Lab Results:  None Other Comments:  None  Review of Systems  All other systems reviewed and are negative.   Dilation: 10 Effacement (%): 100 Station: +2;+3 Exam by:: C Soliz RN Blood pressure 112/71, pulse 78, temperature 98.8 F (37.1 C), temperature source Oral, resp. rate 18, height 5\' 6"  (1.676 m), weight 167 lb (75.751 kg), last menstrual period 04/08/2013, SpO2 100.00%, unknown if currently breastfeeding. Maternal Exam:  Uterine Assessment: Contraction strength is moderate.  Contraction frequency is regular.   Abdomen: Patient reports no abdominal tenderness. Fetal presentation: vertex  Introitus: Normal vulva. Normal vagina.  Pelvis: adequate for delivery.   Cervix: Cervix evaluated by digital exam.     Physical Exam  Nursing note and  vitals reviewed. Constitutional: She is oriented to person, place, and time. She appears well-developed and well-nourished.  HENT:  Head: Normocephalic and atraumatic.  Eyes: Conjunctivae are normal. Pupils are equal, round, and reactive to light.  Neck: Normal range of motion. Neck supple.  Cardiovascular: Normal rate and regular rhythm.   Respiratory: Effort normal and breath sounds normal.  GI: Soft.  Genitourinary: Vagina normal and uterus normal.  Musculoskeletal: Normal range of motion.  Neurological: She is alert and oriented to person, place, and time.  Skin: Skin is warm and dry.  Psychiatric: She has a normal mood and affect. Her behavior is normal. Judgment and thought content normal.    Prenatal labs: ABO, Rh: B/Positive/-- (01/29 0000) Antibody: Negative (01/29 0000) Rubella: Immune (01/29 0000) RPR: Nonreactive (01/29 0000)  HBsAg: Negative (01/29 0000)  HIV: Non-reactive (01/29 0000)  GBS: Negative (04/16 0000)   Assessment/Plan: 38 weeks.  Active labor.  Admit.   Brock Badharles A Harper 01/13/2014, 10:48 AM

## 2014-01-13 NOTE — MAU Note (Signed)
Water broke about ago, pt in wc in lobby with urge to push.  Was here last night, denies problems with preg

## 2014-01-13 NOTE — Progress Notes (Signed)
Notified of pt in active labor, 9cm, went to birthing suites, GBS negative

## 2014-01-13 NOTE — Discharge Instructions (Signed)

## 2014-01-14 LAB — CBC
HCT: 33.4 % — ABNORMAL LOW (ref 36.0–46.0)
Hemoglobin: 11.3 g/dL — ABNORMAL LOW (ref 12.0–15.0)
MCH: 30.3 pg (ref 26.0–34.0)
MCHC: 33.8 g/dL (ref 30.0–36.0)
MCV: 89.5 fL (ref 78.0–100.0)
PLATELETS: 194 10*3/uL (ref 150–400)
RBC: 3.73 MIL/uL — ABNORMAL LOW (ref 3.87–5.11)
RDW: 12.5 % (ref 11.5–15.5)
WBC: 12.2 10*3/uL — ABNORMAL HIGH (ref 4.0–10.5)

## 2014-01-14 NOTE — Progress Notes (Signed)
Patient ID: Sarah BurdockJessica Hamblen, female   DOB: 07/26/1996, 18 y.o.   MRN: [id number] Postpartum day one Vital signs normal Fundus firm Lochia moderate Legs negative doing well and

## 2014-01-14 NOTE — Progress Notes (Signed)
Ur chart review completed.  

## 2014-01-15 NOTE — Discharge Summary (Signed)
Obstetric Discharge Summary Reason for Admission: onset of labor Prenatal Procedures: none Intrapartum Procedures: spontaneous vaginal delivery Postpartum Procedures: none Complications-Operative and Postpartum: none Hemoglobin  Date Value Ref Range Status  01/14/2014 11.3* 12.0 - 15.0 g/dL Final     HCT  Date Value Ref Range Status  01/14/2014 33.4* 36.0 - 46.0 % Final    Physical Exam:  General: alert Lochia: appropriate Uterine Fundus: firm Incision: healing well DVT Evaluation: No evidence of DVT seen on physical exam.  Discharge Diagnoses: Term Pregnancy-delivered  Discharge Information: Date: 01/15/2014 Activity: pelvic rest Diet: routine Medications: Percocet Condition: stable Instructions: refer to practice specific booklet Discharge to: home Follow-up Information   Follow up with Kathreen CosierMARSHALL,BERNARD A, MD.   Specialty:  Obstetrics and Gynecology   Contact information:   [address] [place] [id number] [phone]       Newborn Data: Live born female  Birth Weight: 6 lb 11.4 oz (3045 g) APGAR: 9, 9  Home with mother.  Kathreen CosierBernard A Marshall 01/15/2014, 7:28 AM

## 2014-01-15 NOTE — Progress Notes (Signed)
Patient ID: Sarah BurdockJessica Hester, female   DOB: 11/08/1995, 18 y.o.   MRN: [id number] Postpartum day 2 Vital signs normal Fundus firm Lochia moderate Legs negative discharge

## 2014-01-15 NOTE — Discharge Instructions (Signed)
Discharge instructions   You can wash your hair  Shower  Eat what you want  Drink what you want  See me in 6 weeks  Your ankles are going to swell more in the next 2 weeks than when pregnant  No sex for 6 weeks   Kathreen CosierBernard A Shelda Truby, MD 01/15/2014

## 2014-07-08 ENCOUNTER — Encounter (HOSPITAL_COMMUNITY): Payer: Self-pay | Admitting: *Deleted

## 2015-02-14 IMAGING — US US OB TRANSVAGINAL
1 series · 14 of 28 positions shown · non-contrast
Comparison: None.

CLINICAL DATA: Pelvic pain.  Pregnant.

OBSTETRIC <14 WK US AND TRANSVAGINAL OB US
TECHNIQUE: Both transabdominal and transvaginal ultrasound
examinations were performed for complete evaluation of the
gestation as well as the maternal uterus, adnexal regions, and
pelvic cul-de-sac.  Transvaginal technique was performed to assess
early pregnancy.

[Series 1: us ob transvaginal · 0.26mm/px · 54 acquisitions, 14 frames shown]
[im 2/54]
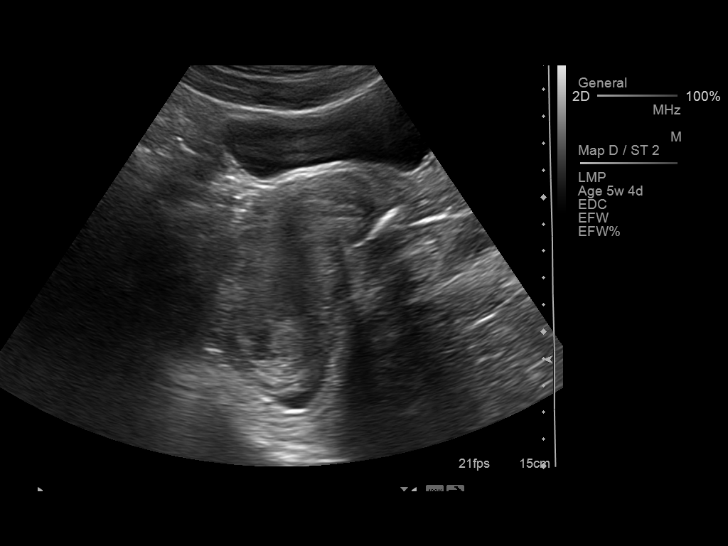
[im 6/54]
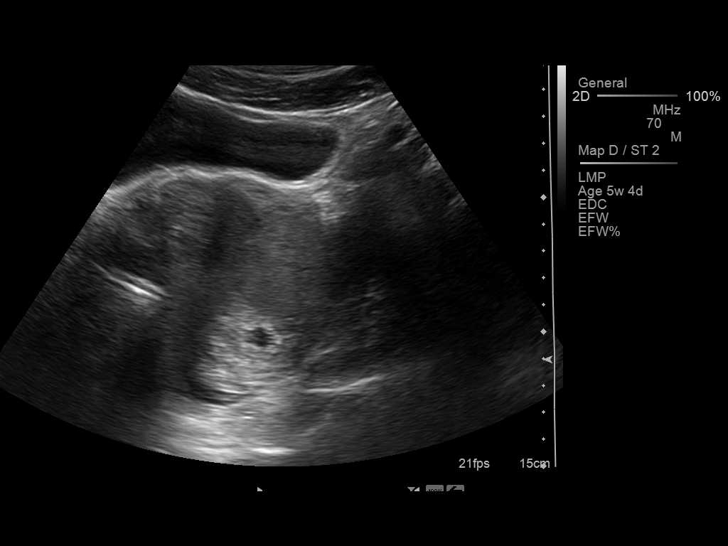
[im 10/54]
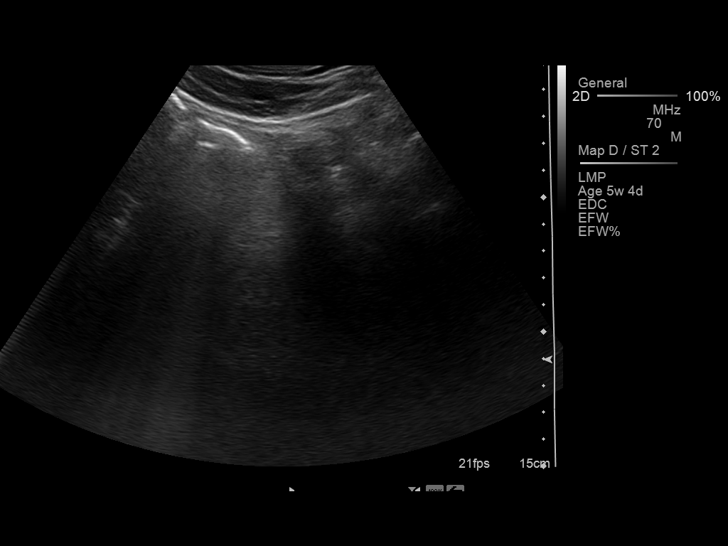
[im 14/54]
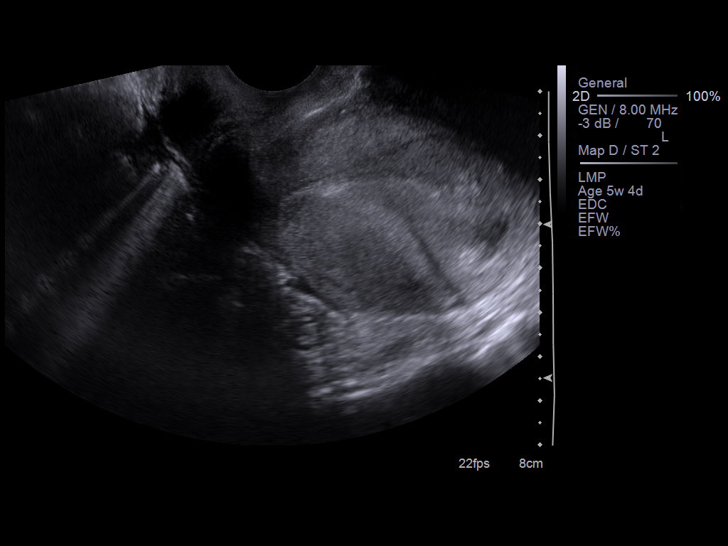
[im 18/54]
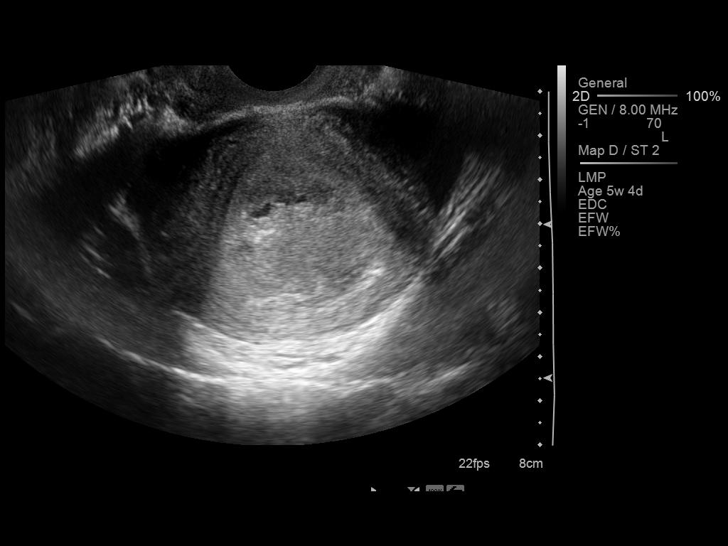
[im 22/54]
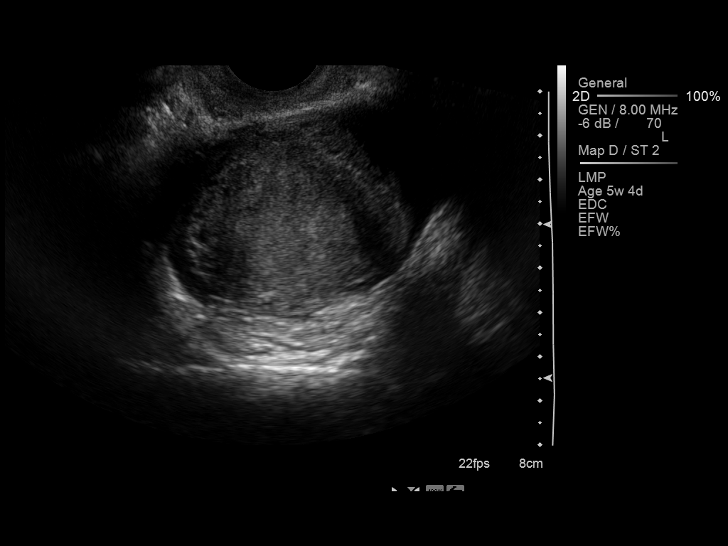
[im 26/54]
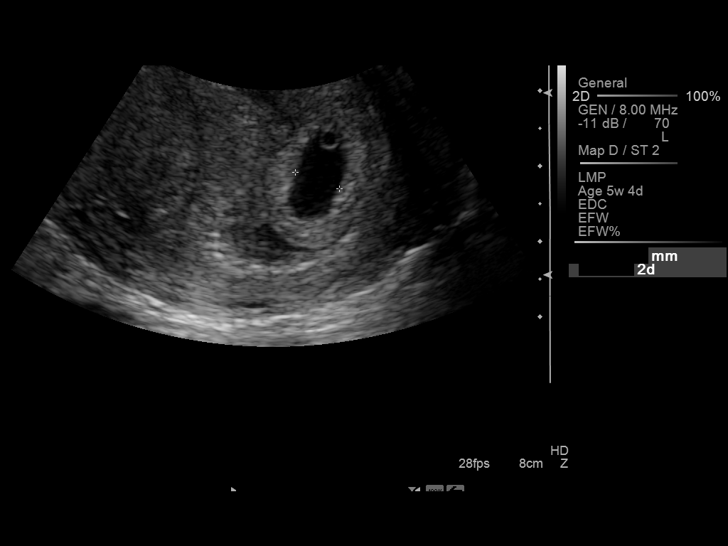
[im 30/54]
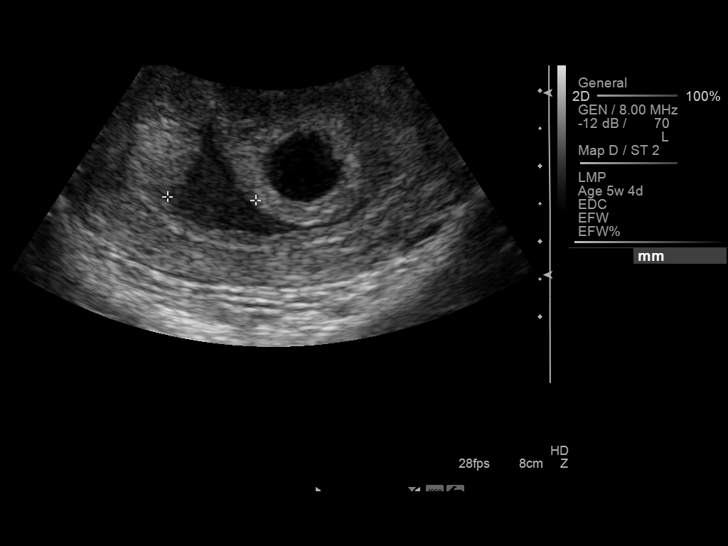
[im 34/54]
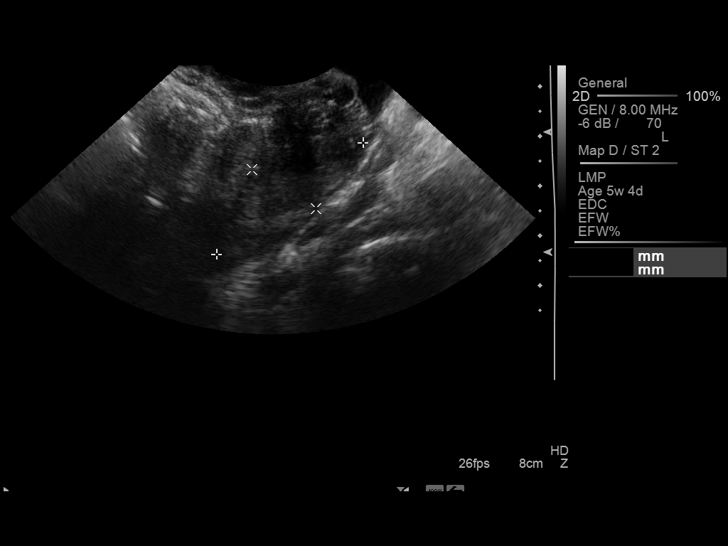
[im 38/54]
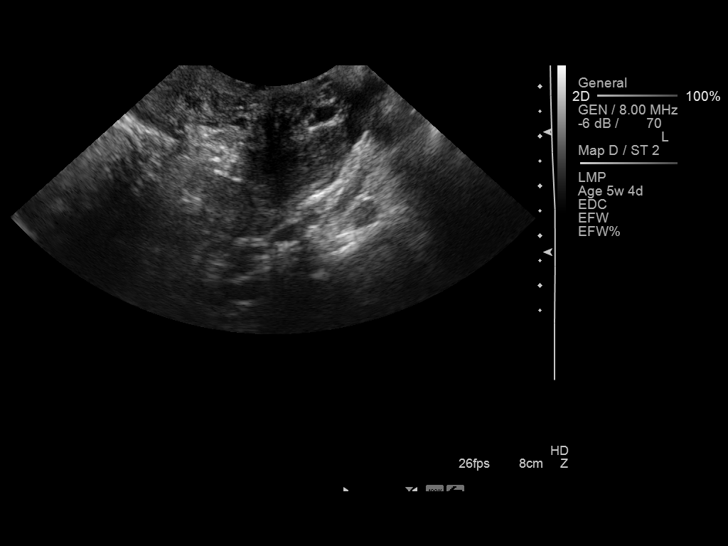
[im 42/54]
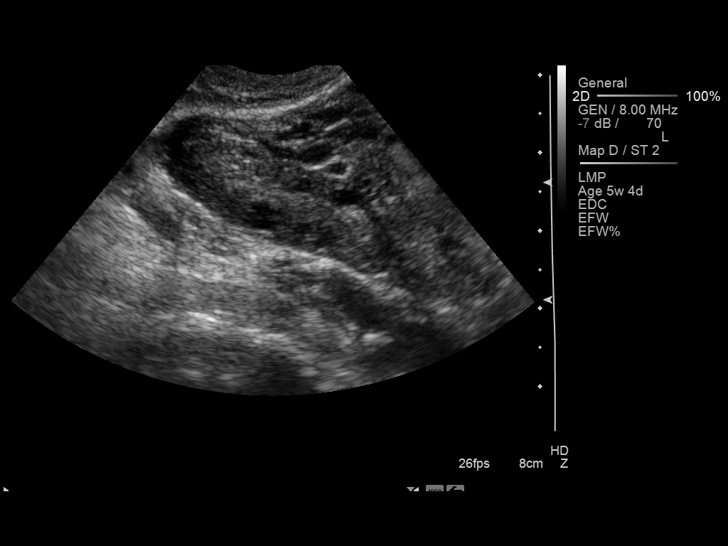
[im 46/54]
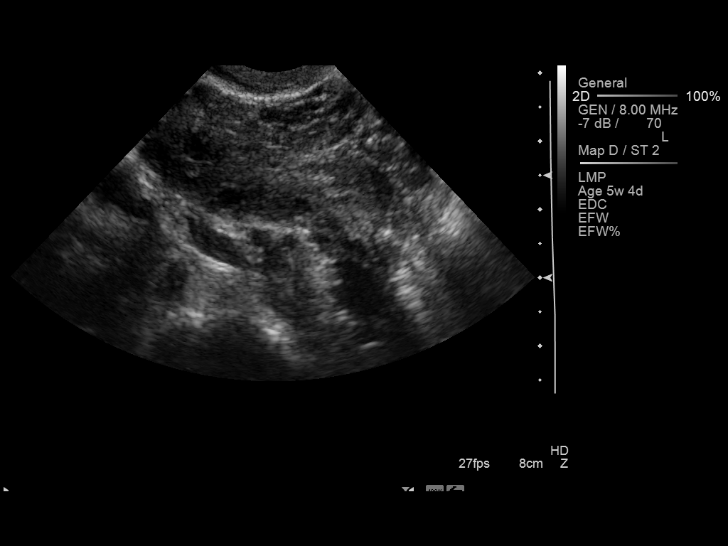
[im 50/54]
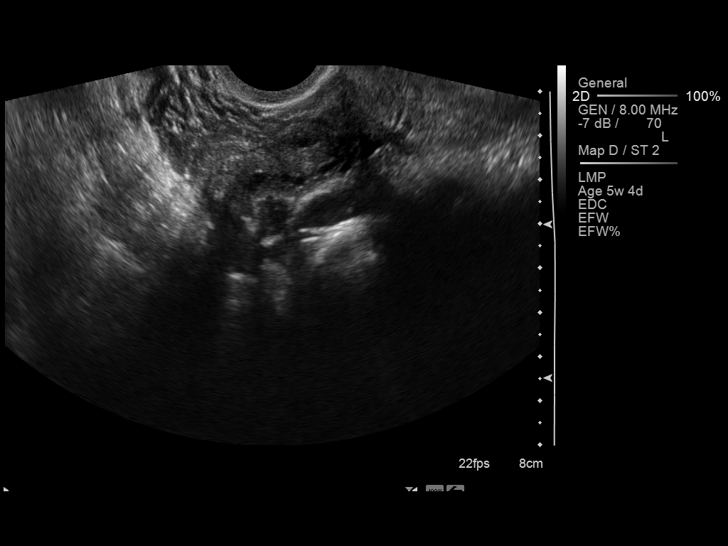
[im 54/54]
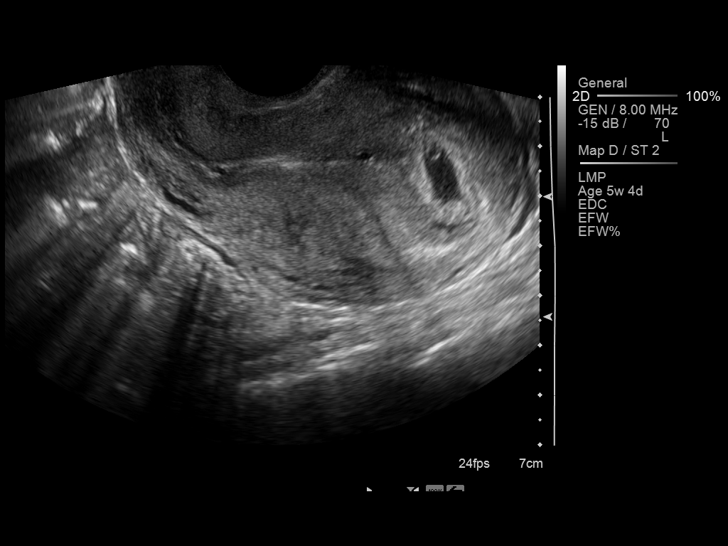

[14 of 28 positions shown; findings below may reference images not displayed]

Intrauterine gestational sac:  Visualized/normal in shape.
Yolk sac: Present.
Embryo: Not visualized.
Cardiac Activity: N/A
Heart Rate: N/A bpm

MSD: 9.7 mm  five w five d
CRL:   mm   w   d             US EDC: 06/24/2013.

Maternal uterus/adnexae:
Small subchorionic hemorrhage is noted.
Normal right ovary.
Normal left ovary.
No free pelvic fluid collections.
IMPRESSION: Intrauterine gestational sac estimated at 5 weeks and 5 days
gestation.  No embryo is visualized yet.  A yolk sac is present.
Small subchorionic hemorrhage.
Normal ovaries.

## 2015-03-25 ENCOUNTER — Encounter (HOSPITAL_COMMUNITY): Payer: Self-pay | Admitting: Emergency Medicine

## 2015-03-25 ENCOUNTER — Emergency Department (HOSPITAL_COMMUNITY)
Admission: EM | Admit: 2015-03-25 | Discharge: 2015-03-25 | Disposition: A | Discharge status: Home / Self Care | Payer: Medicaid Other | Attending: Emergency Medicine | Admitting: Emergency Medicine

## 2015-03-25 DIAGNOSIS — M545 Low back pain, unspecified: Secondary | ICD-10-CM

## 2015-03-25 MED ORDER — NAPROXEN 500 MG PO TABS: 500.0000 mg | ORAL_TABLET | Freq: Two times a day (BID) | ORAL | Status: DC

## 2015-03-25 MED ORDER — METHOCARBAMOL 500 MG PO TABS: 500.0000 mg | ORAL_TABLET | Freq: Two times a day (BID) | ORAL | Status: DC

## 2015-03-25 NOTE — ED Notes (Signed)
Pt started 2 days ago with mid and lower back pain. No known injury. 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 Denies abd pain.

## 2015-03-25 NOTE — Discharge Instructions (Signed)
Back Exercises Return for bowel or bladder incontinence or retention, weakness or numbness in the lower extremities. Back exercises help treat and prevent back injuries. The goal of back exercises is to increase the strength of your abdominal and back muscles and the flexibility of your back. These exercises should be started when you no longer have back pain. Back exercises include:  Pelvic Tilt. Lie on your back with your knees bent. Tilt your pelvis until the lower part of your back is against the floor. Hold this position 5 to 10 sec and repeat 5 to 10 times.  Knee to Chest. Pull first 1 knee up against your chest and hold for 20 to 30 seconds, repeat this with the other knee, and then both knees. This may be done with the other leg straight or bent, whichever feels better.  Sit-Ups or Curl-Ups. Bend your knees 90 degrees. Start with tilting your pelvis, and do a partial, slow sit-up, lifting your trunk only 30 to 45 degrees off the floor. Take at least 2 to 3 seconds for each sit-up. Do not do sit-ups with your knees out straight. If partial sit-ups are difficult, simply do the above but with only tightening your abdominal muscles and holding it as directed.  Hip-Lift. Lie on your back with your knees flexed 90 degrees. Push down with your feet and shoulders as you raise your hips a couple inches off the floor; hold for 10 seconds, repeat 5 to 10 times.  Back arches. Lie on your stomach, propping yourself up on bent elbows. Slowly press on your hands, causing an arch in your low back. Repeat 3 to 5 times. Any initial stiffness and discomfort should lessen with repetition over time.  Shoulder-Lifts. Lie face down with arms beside your body. Keep hips and torso pressed to floor as you slowly lift your head and shoulders off the floor. Do not overdo your exercises, especially in the beginning. Exercises may cause you some mild back discomfort which lasts for a few minutes; however, if the pain is  more severe, or lasts for more than 15 minutes, do not continue exercises until you see your caregiver. Improvement with exercise therapy for back problems is slow.  See your caregivers for assistance with developing a proper back exercise program. Document Released: 09/30/2004 Document Revised: 11/15/2011 Document Reviewed: 06/24/2011 [facility] Patient Information 2015 [employer]. This information is not intended to replace advice given to you by your health care provider. Make sure you discuss any questions you have with your health care provider.

## 2015-03-25 NOTE — ED Provider Notes (Signed)
CSN: [id number]     Arrival date & time 03/25/15  1326 History  This chart was scribed for non-physician practitioner Catha Gosselin, PA-C working with Purvis Sheffield, MD by Lyndel Safe, ED Scribe. This patient was seen in room TR01C/TR01C and the patient's care was started at 2:22 PM.    Chief Complaint  Patient presents with  . Back Pain   The history is provided by the patient. No language interpreter was used.   HPI Comments: Sarah Hester is a 19 y.o. female, with no pertinent PMhx,  who presents to the Emergency Department complaining of gradually worsening, constant, moderate mid-lower back pain onset 3 days ago. She describes the pain to feel like spasms. Pt states the pain is exacerbated with bending over and worsens at night. She has taken tylenol and ibuprofen with mild to no relief. She states she is on her feet throughout long work shifts, standing on a concrete floor. She was ambulatory without difficulty today in the ED. Pt denies an injury that is attributable to the pain, steroid use, IV drug use, bowel or bladder incontinence, or any urinary symptoms. Additionally denies radiation of the pain down her legs, previous similar pain in her back, or any other arthralgias or myalgias. She denies any history of fall.  Past Medical History  Diagnosis Date  . Medical history non-contributory    Past Surgical History  Procedure Laterality Date  . No past surgeries     Family History  Problem Relation Age of Onset  . Diabetes Maternal Grandmother   . Heart disease Neg Hx   . Hypertension Neg Hx   . Stroke Neg Hx   . Mental illness Neg Hx   . Drug abuse Neg Hx   . Depression Neg Hx    History  Substance Use Topics  . Smoking status: Never Smoker   . Smokeless tobacco: Never Used  . Alcohol Use: No   OB History    Gravida Para Term Preterm AB TAB SAB Ectopic Multiple Living   Review of Systems  Constitutional: Negative for fever.   Genitourinary: Negative for dysuria, urgency and frequency.  Musculoskeletal: Positive for back pain. Negative for arthralgias and gait problem.   Allergies  Review of patient's allergies indicates no known allergies.  Home Medications   Prior to Admission medications   Medication Sig Start Date End Date Taking? Authorizing Provider  methocarbamol (ROBAXIN) 500 MG tablet Take 1 tablet (500 mg total) by mouth 2 (two) times daily. 03/25/15   Pairlee Sawtell Patel-Mills, PA-C  naproxen (NAPROSYN) 500 MG tablet Take 1 tablet (500 mg total) by mouth 2 (two) times daily. 03/25/15   Marybella Ethier Patel-Mills, PA-C   BP 125/57 mmHg  Pulse 72  Temp(Src) 98.6 F (37 C) (Oral)  Resp 14  Ht  (1.676 m)  Wt 160 lb (72.576 kg)  BMI 25.84 kg/m2  SpO2 99% Physical Exam  Constitutional: She appears well-developed and well-nourished. No distress.  HENT:  Head: Normocephalic.  Eyes: Conjunctivae are normal. Right eye exhibits no discharge. Left eye exhibits no discharge. No scleral icterus.  Neck: No JVD present.  Pulmonary/Chest: Effort normal. No respiratory distress.  Musculoskeletal: Normal range of motion. She exhibits tenderness.  Bilateral paravertebral lumbar TTP consistent with spasm; no midline lumbar tenderness; 5/5 strength in BLE; no saddle anesthesia; negative straight leg raise; ambulatory with steady gait.   Neurological: She is alert. Coordination normal.  Skin:  Skin is warm. No rash noted. No erythema. No pallor.  Psychiatric: She has a normal mood and affect. Her behavior is normal.  Nursing note and vitals reviewed.  ED Course  Procedures  DIAGNOSTIC STUDIES: Oxygen Saturation is 99% on RA, normal by my interpretation.    COORDINATION OF CARE: 2:26 PM Discussed treatment plan which includes to order anti-inflammatory injection with pt. Pt acknowledges and agrees to plan.   Labs Review Labs Reviewed - No data to display  Imaging Review No results found.   EKG  Interpretation None      MDM   Final diagnoses:  Bilateral low back pain without sciatica  Patient is here for bilateral back pain. She has no concerns for cauda equina syndrome. I do not believe imaging is necessary. She is afebrile with no lower extremity weakness or numbness. I think this is consistent with muscle spasm. She is ambulatory with steady gait. I prescribed the patient Robaxin and naproxen. Patient verbally agrees with the plan and there are no concerns that were not addressed.  I personally performed the services described in this documentation, which was scribed in my presence. The recorded information has been reviewed and is accurate.   Catha GosselinHanna Patel-Mills, PA-C 03/25/15 1511  Purvis SheffieldForrest Harrison, MD 03/25/15 1601

## 2015-03-28 ENCOUNTER — Emergency Department (HOSPITAL_COMMUNITY)
Admission: EM | Admit: 2015-03-28 | Discharge: 2015-03-28 | Disposition: A | Discharge status: Home / Self Care | Payer: Medicaid Other | Attending: Emergency Medicine | Admitting: Emergency Medicine

## 2015-03-28 ENCOUNTER — Encounter (HOSPITAL_COMMUNITY): Payer: Self-pay | Admitting: Emergency Medicine

## 2015-03-28 DIAGNOSIS — Z79899 Other long term (current) drug therapy: Secondary | ICD-10-CM | POA: Insufficient documentation

## 2015-03-28 DIAGNOSIS — Z791 Long term (current) use of non-steroidal anti-inflammatories (NSAID): Secondary | ICD-10-CM | POA: Insufficient documentation

## 2015-03-28 DIAGNOSIS — Z0289 Encounter for other administrative examinations: Secondary | ICD-10-CM | POA: Insufficient documentation

## 2015-03-28 DIAGNOSIS — Z09 Encounter for follow-up examination after completed treatment for conditions other than malignant neoplasm: Secondary | ICD-10-CM

## 2015-03-28 NOTE — ED Notes (Signed)
Pt states she was seen in ED 3 days ago for back pain.  States she feels better but needs a note saying she is cleared to go back to work because she does a lot of standing.

## 2015-03-28 NOTE — ED Provider Notes (Signed)
CSN: [id number]     Arrival date & time 03/28/15  2109 History  This chart was scribed for Kerrie Buffalo, NP working with Pricilla Loveless, MD by Evon Slack, ED Scribe. This patient was seen in room TR06C/TR06C and the patient's care was started at 9:18 PM.    Chief Complaint  Patient presents with  . work note    Patient is a 19 y.o. female presenting with back pain. The history is provided by the patient. No language interpreter was used.  Back Pain Location:  Lumbar spine Radiates to:  Does not radiate Pain severity:  No pain Progression:  Resolved Chronicity:  New Relieved by:  NSAIDs and muscle relaxants  HPI Comments: Sarah Hester is a 19 y.o. female who presents to the Emergency Department for work note. Pt states that she was having back pain 3 days ago. She states that she receieved muscle relaxer's and an anti-inflammatory.  She states that her back pain has resolved. Pt doesn't report any other symptoms. Patient request work note to return to full duty without restrictions.    Past Medical History  Diagnosis Date  . Medical history non-contributory    Past Surgical History  Procedure Laterality Date  . No past surgeries     Family History  Problem Relation Age of Onset  . Diabetes Maternal Grandmother   . Heart disease Neg Hx   . Hypertension Neg Hx   . Stroke Neg Hx   . Mental illness Neg Hx   . Drug abuse Neg Hx   . Depression Neg Hx    History  Substance Use Topics  . Smoking status: Never Smoker   . Smokeless tobacco: Never Used  . Alcohol Use: No   OB History    Gravida Para Term Preterm AB TAB SAB Ectopic Multiple Living   Review of Systems  Musculoskeletal: Negative for back pain.  All other systems reviewed and are negative.    Allergies  Review of patient's allergies indicates no known allergies.  Home Medications   Prior to Admission medications   Medication Sig Start Date End Date Taking? Authorizing Provider   methocarbamol (ROBAXIN) 500 MG tablet Take 1 tablet (500 mg total) by mouth 2 (two) times daily. 03/25/15   Hanna Patel-Mills, PA-C  naproxen (NAPROSYN) 500 MG tablet Take 1 tablet (500 mg total) by mouth 2 (two) times daily. 03/25/15   Hanna Patel-Mills, PA-C   BP 127/70 mmHg  Pulse 86  Temp(Src) 98.5 F (36.9 C) (Oral)  Resp 16  Ht  (1.676 m)  Wt 145 lb (65.772 kg)  BMI 23.41 kg/m2  SpO2 98%  LMP 03/19/2015   Physical Exam  Constitutional: She is oriented to person, place, and time. She appears well-developed and well-nourished. No distress.  HENT:  Head: Normocephalic and atraumatic.  Eyes: Conjunctivae and EOM are normal.  Neck: Neck supple. No tracheal deviation present.  Cardiovascular: Normal rate.   Pulmonary/Chest: Effort normal. No respiratory distress.  Musculoskeletal: Normal range of motion. She exhibits no tenderness.  Full ROM of back with no pain.   Neurological: She is alert and oriented to person, place, and time.  Skin: Skin is warm and dry.  Psychiatric: She has a normal mood and affect. Her behavior is normal.  Nursing note and vitals reviewed.   ED Course  Procedures (including critical care time) DIAGNOSTIC STUDIES: Oxygen Saturation is 98% on RA, normal by  my interpretation.    COORDINATION OF CARE: 9:28 PM-Discussed treatment plan with pt at bedside and pt agreed to plan.     MDM  19 y.o. female here tonight for work note to return to work without restrictions. Stable for d/c without any symptoms. Note to return to work given to the patient.  Final diagnoses:  Follow up    I personally performed the services described in this documentation, which was scribed in my presence. The recorded information has been reviewed and is accurate.     [address] [place], [place] 03/29/15 9147  Pricilla Loveless, MD 03/29/15 [phone]

## 2015-03-28 NOTE — Discharge Instructions (Signed)
You may return to work without restrictions. Return as needed for problems.

## 2015-04-15 ENCOUNTER — Emergency Department (HOSPITAL_COMMUNITY): Payer: Medicaid Other

## 2015-04-15 ENCOUNTER — Encounter (HOSPITAL_COMMUNITY): Payer: Self-pay | Admitting: Emergency Medicine

## 2015-04-15 ENCOUNTER — Emergency Department (HOSPITAL_COMMUNITY)
Admission: EM | Admit: 2015-04-15 | Discharge: 2015-04-15 | Discharge status: Against Medical Advice | Payer: Medicaid Other | Attending: Emergency Medicine | Admitting: Emergency Medicine

## 2015-04-15 DIAGNOSIS — J029 Acute pharyngitis, unspecified: Secondary | ICD-10-CM | POA: Insufficient documentation

## 2015-04-15 DIAGNOSIS — Z79899 Other long term (current) drug therapy: Secondary | ICD-10-CM | POA: Insufficient documentation

## 2015-04-15 DIAGNOSIS — Y9289 Other specified places as the place of occurrence of the external cause: Secondary | ICD-10-CM | POA: Insufficient documentation

## 2015-04-15 DIAGNOSIS — Y9389 Activity, other specified: Secondary | ICD-10-CM | POA: Insufficient documentation

## 2015-04-15 DIAGNOSIS — Z791 Long term (current) use of non-steroidal anti-inflammatories (NSAID): Secondary | ICD-10-CM | POA: Insufficient documentation

## 2015-04-15 DIAGNOSIS — Y998 Other external cause status: Secondary | ICD-10-CM | POA: Insufficient documentation

## 2015-04-15 DIAGNOSIS — S199XXA Unspecified injury of neck, initial encounter: Secondary | ICD-10-CM | POA: Insufficient documentation
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
## 2015-04-15 NOTE — ED Notes (Signed)
Patient is not in room went outside patient was walking to the top of parking lot.  i made nurse aware.

## 2015-04-16 ENCOUNTER — Emergency Department (HOSPITAL_COMMUNITY): Payer: Medicaid Other

## 2015-04-16 ENCOUNTER — Emergency Department (HOSPITAL_COMMUNITY)
Admission: EM | Admit: 2015-04-16 | Discharge: 2015-04-16 | Disposition: A | Discharge status: Home / Self Care | Payer: Medicaid Other | Attending: Emergency Medicine | Admitting: Emergency Medicine

## 2015-04-16 ENCOUNTER — Encounter (HOSPITAL_COMMUNITY): Payer: Self-pay | Admitting: Emergency Medicine

## 2015-04-16 DIAGNOSIS — Y9389 Activity, other specified: Secondary | ICD-10-CM | POA: Insufficient documentation

## 2015-04-16 DIAGNOSIS — S199XXA Unspecified injury of neck, initial encounter: Secondary | ICD-10-CM | POA: Insufficient documentation

## 2015-04-16 DIAGNOSIS — S3992XA Unspecified injury of lower back, initial encounter: Secondary | ICD-10-CM | POA: Insufficient documentation

## 2015-04-16 DIAGNOSIS — Y998 Other external cause status: Secondary | ICD-10-CM | POA: Insufficient documentation

## 2015-04-16 DIAGNOSIS — Z79899 Other long term (current) drug therapy: Secondary | ICD-10-CM | POA: Insufficient documentation

## 2015-04-16 DIAGNOSIS — Y9289 Other specified places as the place of occurrence of the external cause: Secondary | ICD-10-CM | POA: Insufficient documentation

## 2015-04-16 DIAGNOSIS — Z791 Long term (current) use of non-steroidal anti-inflammatories (NSAID): Secondary | ICD-10-CM | POA: Insufficient documentation

## 2015-04-16 DIAGNOSIS — M542 Cervicalgia: Secondary | ICD-10-CM

## 2015-04-16 MED ORDER — IOHEXOL 300 MG/ML  SOLN
75.0000 mL | Freq: Once | INTRAMUSCULAR | Status: AC | PRN
  Administered 2015-04-16: 75 mL via INTRAVENOUS

## 2015-04-16 MED ORDER — TRAMADOL HCL 50 MG PO TABS
50.0000 mg | ORAL_TABLET | Freq: Four times a day (QID) | ORAL | Status: DC | PRN
Start: 2015-04-16 — End: 2015-11-12

## 2015-04-16 MED ORDER — NAPROXEN 500 MG PO TABS: 500.0000 mg | ORAL_TABLET | Freq: Two times a day (BID) | ORAL | Status: DC
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
## 2015-04-16 NOTE — ED Notes (Signed)
IV start unsuccessful x 2 attempts.
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
## 2015-04-16 NOTE — Discharge Instructions (Signed)
Naprosyn for pain or inflammation. Tramadol for severe pain. Follow up with primary care doctor as needed.   Soft Tissue Injury of the Neck A soft tissue injury of the neck may be either blunt or penetrating. A blunt injury does not break the skin. A penetrating injury breaks the skin, creating an open wound. Blunt injuries may happen in several ways. Most involve some type of direct blow to the neck. This can cause serious injury to the windpipe, voice box, cervical spine, or esophagus. In some cases, the injury to the soft tissue can also result in a break (fracture) of the cervical spine.  Soft tissue injuries of the neck require immediate medical care. Sometimes, you may not notice the signs of injury right away. You may feel fine at first, but the swelling may eventually close off your airway. This could result in a significant or life-threatening injury. This is rare, but it is important to keep in mind with any injury to the neck.  CAUSES  Causes of blunt injury may include:  "Clothesline" injuries. This happens when someone is moving at high speed and runs into a clothesline, outstretched arm, or similar object. This results in a direct injury to the front of the neck. If the airway is blocked, it can cause suffocation due to lack of oxygen (asphyxiation) or even instant death.  High-energy trauma. This includes injuries from motor vehicle crashes, falling from a great height, or heavy objects falling onto the neck.  Sports-related injuries. Injury to the windpipe and voice box can result from being struck by another player or being struck by an object, such as a baseball, hockey stick, or an outstretched arm.  Strangulation. This type of injury may cause skin trauma, hoarseness of voice, or broken cartilage in the voice box or windpipe. It may also cause a serious airway problem. SYMPTOMS   Bruising.  Pain and tenderness in the neck.  Swelling of the neck and face.  Hoarseness of  voice.  Pain or difficulty with swallowing.  Drooling or inability to swallow.  Trouble breathing. This may become worse when lying flat.  Coughing up blood.  High-pitched, harsh, vibratory noise due to partial obstruction of the windpipe (stridor).  Swelling of the upper arms.  Windpipe that appears to be pushed off to one side.  Air in the tissues under the skin of the neck or chest (subcutaneous emphysema). This usually indicates a problem with the normal airway and is a medical emergency. DIAGNOSIS   If possible, your caregiver may ask about the details of how the injury occurred. A detailed exam can help to identify specific areas of the neck that are injured.  Your caregiver may ask for tests to rule out injury of the voice box, airway, or esophagus. This may include X-rays, ultrasounds, CT scans, or MRI scans, depending on the severity of your injury. TREATMENT  If you have an injury to your windpipe or voice box, immediate medical care is required. In almost all cases, hospitalization is necessary. For injuries that do not appear to require surgery, it is helpful to have medical observation for 24 hours. You may be asked to do one or more of the following:  Rest your voice.  Bed rest.  Limit your diet, depending on the extent of the injury. Follow your caregiver's dietary guidelines. Often, only fluids and soft foods are recommended.  Keep your head raised.  Breathe humidified air.  Take medicines to control infection, reduce swelling, and reduce normal  stomach acid. You may also need pain medicine, depending on your injury. For injuries that appear to require surgery, you will need to stay in the hospital. The exact type of procedure needed will depend on your exact injury or injuries.  HOME CARE INSTRUCTIONS   If the skin was broken, keep the wound area clean and dry. Wear your bandage (dressing) and care for your wound as instructed.  Follow your caregiver's advice  about your diet.  Follow your caregiver's advice about use of your voice.  Take medicines as directed.  Keep your head and neck at least partially raised (elevated) while recovering. This should also be done while sleeping. SEEK MEDICAL CARE IF:   Your voice becomes weaker.  Your swelling or bruising is not improving as expected. Typically, this takes several days to improve.  You feel that you are having problems with medicines prescribed.  You have drainage from the injury site. This may be a sign that your wound is not healing properly or is infected.  You develop increasing pain or difficulty while swallowing.  You develop an oral temperature of 102 F (38.9 C) or higher. SEEK IMMEDIATE MEDICAL CARE IF:   You cough up blood.  You develop sudden trouble breathing.  You cannot tolerate your oral medicines, or you are unable to swallow.  You develop drooling.  You have new or worsening vomiting.  You develop sudden, new swelling of the neck or face.  You have an oral temperature above 102 F (38.9 C), not controlled by medicine. MAKE SURE YOU:  Understand these instructions.  Will watch your condition.  Will get help right away if you are not doing well or get worse. Document Released: 11/30/2007 Document Revised: 11/15/2011 Document Reviewed: 11/09/2010 [facility] Patient Information 2015 [place], [place]. This information is not intended to replace advice given to you by your health care provider. Make sure you discuss any questions you have with your health care provider.

## 2015-11-06 ENCOUNTER — Emergency Department (HOSPITAL_COMMUNITY): Payer: Medicaid Other

## 2015-11-06 ENCOUNTER — Encounter (HOSPITAL_COMMUNITY): Payer: Self-pay | Admitting: Emergency Medicine

## 2015-11-06 ENCOUNTER — Emergency Department (HOSPITAL_COMMUNITY)
Admission: EM | Admit: 2015-11-06 | Discharge: 2015-11-06 | Disposition: A | Discharge status: Home / Self Care | Payer: Medicaid Other | Attending: Emergency Medicine | Admitting: Emergency Medicine

## 2015-11-06 DIAGNOSIS — R079 Chest pain, unspecified: Secondary | ICD-10-CM | POA: Insufficient documentation

## 2015-11-06 LAB — BASIC METABOLIC PANEL
ANION GAP: 6 (ref 5–15)
BUN: 9 mg/dL (ref 6–20)
CALCIUM: 8.6 mg/dL — AB (ref 8.9–10.3)
CO2: 24 mmol/L (ref 22–32)
Chloride: 106 mmol/L (ref 101–111)
Creatinine, Ser: 0.78 mg/dL (ref 0.44–1.00)
GFR calc non Af Amer: >60 mL/min (ref >60)
Glucose, Bld: 86 mg/dL (ref 65–99)
POTASSIUM: 3.9 mmol/L (ref 3.5–5.1)
Sodium: 136 mmol/L (ref 135–145)

## 2015-11-06 LAB — CBC
HCT: 36.9 % (ref 36.0–46.0)
HEMOGLOBIN: 12.3 g/dL (ref 12.0–15.0)
MCH: 30.4 pg (ref 26.0–34.0)
MCHC: 33.3 g/dL (ref 30.0–36.0)
MCV: 91.1 fL (ref 78.0–100.0)
Platelets: 277 10*3/uL (ref 150–400)
RBC: 4.05 MIL/uL (ref 3.87–5.11)
RDW: 12.7 % (ref 11.5–15.5)
WBC: 4.6 10*3/uL (ref 4.0–10.5)

## 2015-11-06 LAB — I-STAT TROPONIN, ED: Troponin i, poc: 0 ng/mL (ref 0.00–0.08)

## 2015-11-06 NOTE — ED Notes (Signed)
No answer when called for room x1.  

## 2015-11-06 NOTE — ED Notes (Signed)
Patient presents right sided CP described as burning x1 day, rates pain as a 7/10. Denies N/V, diaphoresis, lightheadedness, dizziness.

## 2015-11-06 NOTE — ED Notes (Signed)
No answer when called for room a 3rd and final time.

## 2015-11-06 NOTE — ED Notes (Signed)
No answer when called for room x 2 

## 2015-11-11 ENCOUNTER — Emergency Department (HOSPITAL_COMMUNITY)
Admission: EM | Admit: 2015-11-11 | Discharge: 2015-11-11 | Discharge status: Against Medical Advice | Payer: Medicaid Other

## 2015-11-11 ENCOUNTER — Encounter (HOSPITAL_COMMUNITY): Payer: Self-pay | Admitting: Emergency Medicine

## 2015-11-11 DIAGNOSIS — Y9289 Other specified places as the place of occurrence of the external cause: Secondary | ICD-10-CM | POA: Insufficient documentation

## 2015-11-11 DIAGNOSIS — S62336A Displaced fracture of neck of fifth metacarpal bone, right hand, initial encounter for closed fracture: Secondary | ICD-10-CM | POA: Insufficient documentation

## 2015-11-11 DIAGNOSIS — Y9389 Activity, other specified: Secondary | ICD-10-CM | POA: Insufficient documentation

## 2015-11-11 DIAGNOSIS — W228XXA Striking against or struck by other objects, initial encounter: Secondary | ICD-10-CM | POA: Insufficient documentation

## 2015-11-11 DIAGNOSIS — Z791 Long term (current) use of non-steroidal anti-inflammatories (NSAID): Secondary | ICD-10-CM | POA: Insufficient documentation

## 2015-11-11 DIAGNOSIS — Y990 Civilian activity done for income or pay: Secondary | ICD-10-CM | POA: Insufficient documentation

## 2015-11-11 NOTE — ED Notes (Signed)
No answer when pt's name called; pt eloped from waiting room. 

## 2015-11-11 NOTE — ED Notes (Signed)
Pt. accidentally hit her right hand against metal freezer door at work this evening , reports pain with mild swelling at right hand .

## 2015-11-11 NOTE — ED Notes (Signed)
No answer when pt's name called in the waiting room 

## 2015-11-12 ENCOUNTER — Emergency Department (HOSPITAL_COMMUNITY)
Admission: EM | Admit: 2015-11-12 | Discharge: 2015-11-12 | Disposition: A | Discharge status: Home / Self Care | Payer: Medicaid Other | Attending: Emergency Medicine | Admitting: Emergency Medicine

## 2015-11-12 ENCOUNTER — Emergency Department (HOSPITAL_COMMUNITY): Payer: Medicaid Other

## 2015-11-12 DIAGNOSIS — S62339A Displaced fracture of neck of unspecified metacarpal bone, initial encounter for closed fracture: Secondary | ICD-10-CM

## 2015-11-12 MED ORDER — OXYCODONE-ACETAMINOPHEN 5-325 MG PO TABS
ORAL_TABLET | ORAL | Status: AC
  Filled 2015-11-12: qty 2

## 2015-11-12 MED ORDER — TRAMADOL HCL 50 MG PO TABS: 50.0000 mg | ORAL_TABLET | Freq: Two times a day (BID) | ORAL | Status: DC | PRN

## 2015-11-12 MED ORDER — KETOROLAC TROMETHAMINE 60 MG/2ML IM SOLN
60.0000 mg | Freq: Once | INTRAMUSCULAR | Status: AC
  Administered 2015-11-12: 60 mg via INTRAMUSCULAR

## 2015-11-12 MED ORDER — OXYCODONE-ACETAMINOPHEN 5-325 MG PO TABS
2.0000 | ORAL_TABLET | Freq: Once | ORAL | Status: AC
  Administered 2015-11-12: 2 via ORAL

## 2015-11-12 MED ORDER — KETOROLAC TROMETHAMINE 60 MG/2ML IM SOLN
INTRAMUSCULAR | Status: AC
  Filled 2015-11-12: qty 2

## 2015-11-12 NOTE — Progress Notes (Signed)
Orthopedic Tech Progress Note Patient Details:  Sarah BurdockJessica Hester 12/14/1995 [id number]  Ortho Devices Type of Ortho Device: Ulna gutter splint Ortho Device/Splint Location: rue Ortho Device/Splint Interventions: Ordered, Application   Trinna PostMartinez, [name] 11/12/2015, 3:12 AM

## 2015-11-12 NOTE — Discharge Instructions (Signed)
Boxer's Fracture Sarah Hester, your xray shows a broken bone of your 5th metacarpal.  See orthopaedic surgery within 3 days for close follow up.  Take ibuprofen at home for pain, if it becomes severe, take tramadol.  If any symptoms worsen, or if the cast ever feels too tight and you have numbness or worsening pain, come back to the ED immediately. Thank you. A boxer's fracture is a break (fracture) of the bone in your hand that connects your little finger to your wrist (fifth metacarpal). This type of fracture usually happens at the end of the bone, closest to the little finger. The knuckle is often pushed down by the impact. In some cases, only a splint or brace is needed, or you may need a cast. Casting or splinting may include taping your injured finger to the next finger (buddy taping). You may need surgery to repair the fracture. This may involve the use of wires, screws, or plates to hold the bone pieces in place.  CAUSES This injury may be caused by:   Hitting an object with a clenched fist.  A hard, direct hit to the hand.  An injury that crushes the hand. RISK FACTORS This injury is more likely to occur if:  You are in a fistfight.  You have certain bone diseases. SYMPTOMS Symptoms of this type of fracture develop soon after the injury. Symptoms may include:  Swelling of the hand.  Pain.  Pain when moving the fifth finger or touching the hand.  Abnormal position of the finger.  Not being able to move the finger.  A shortened finger.  A finger knuckle that looks sunken in. DIAGNOSIS This injury may be diagnosed based on your symptoms, especially if you had a recent hand injury. Your health care provider will perform a physical exam, and you may also have X-rays to confirm the diagnosis. TREATMENT  Treatment for this injury depends on how severe it is. Possible treatments include:  Closed reduction. If your bone is stable and can be moved back into place, you may only  need to wear a cast or splint or have buddy taping.  Open reduction with internal fixation (ORIF). This may be needed if your fracture is far out of place or goes through the joint surface of the bone. This treatment involves open surgery to move your bones back into the right position. Screws, wires, or plates may be used to stabilize the fracture. You may need to wear a cast or a splint for several weeks. You will also need to have follow-up X-rays to make sure that the bone is healing well and staying in position. After you no longer need the cast or splint, you may need physical therapy. This will help you to regain full movement and strength in your hand.  HOME CARE INSTRUCTIONS If You Have a Cast:  Do not stick anything inside the cast to scratch your skin. Doing that increases your risk of infection.  Check the skin around the cast every day. Report any concerns to your health care provider. You may put lotion on dry skin around the edges of the cast. Do not apply lotion to the skin underneath the cast. If You Have a Splint:  Wear it as directed by your health care provider. Remove it only as directed by your health care provider.  Loosen the splint if your fingers become numb and tingle, or if they turn cold and blue. Bathing  Cover the cast or splint with a  watertight plastic bag to protect it from water while you take a bath or a shower. Do not let the cast or splint get wet. Managing Pain, Stiffness, and Swelling  If directed, apply ice to the injured area (if you have a splint, not a cast):  Put ice in a plastic bag.  Place a towel between your skin and the bag.  Leave the ice on for 20 minutes, 2-3 times a day.  Move your fingers often to avoid stiffness and to lessen swelling.  Raise the injured area above the level of your heart while you are sitting or lying down. Driving  Do not drive or operate heavy machinery while taking pain medicine.  Do not drive while  wearing a cast or splint on a hand or foot that you use for driving. Activity  Return to your normal activities as directed by your health care provider. Ask your health care provider what activities are safe for you. General Instructions  Do not put pressure on any part of the cast or splint until it is fully hardened. This may take several hours.  Keep the cast or splint clean and dry.  Do not use any tobacco products, including cigarettes, chewing tobacco, or electronic cigarettes. Tobacco can delay bone healing. If you need help quitting, ask your health care provider.  Take medicines only as directed by your health care provider.  Keep all follow-up visits as directed by your health care provider. This is important. SEEK MEDICAL CARE IF:  Your pain is getting worse.  You have redness, swelling, or pain in the injured area.  You have fluid, blood, or pus coming from under your cast or splint.  You notice a bad smell coming from under your cast or splint.  You have a fever.  Your cast or splint feels too tight or too loose.  You cast is coming apart. SEEK IMMEDIATE MEDICAL CARE IF:  You develop a rash.  You have trouble breathing.  Your skin or nails on your injured hand turn blue or gray even after you loosen your splint.  Your injured hand feels cold or becomes numb even after you loosen your splint.  You develop severe pain under the cast or in your hand.   This information is not intended to replace advice given to you by your health care provider. Make sure you discuss any questions you have with your health care provider.   Document Released: 08/23/2005 Document Revised: 05/14/2015 Document Reviewed: 06/12/2014 Elsevier Interactive Patient Education [employer]

## 2015-11-12 NOTE — ED Notes (Signed)
Patient left at this time with all belongings. 

## 2015-11-12 NOTE — ED Provider Notes (Signed)
CSN: [id number]     Arrival date & time 11/11/15  2303 History  By signing my name below, I, Budd PalmerVanessa Prueter, attest that this documentation has been prepared under the direction and in the presence of Tomasita CrumbleAdeleke Tonia Avino, MD. Electronically Signed: Budd PalmerVanessa Prueter, ED Scribe. 11/12/2015. 2:44 AM.     Chief Complaint  Patient presents with  . Hand Injury   The history is provided by the patient. No language interpreter was used.   HPI Comments: Sarah Hester is a 20 y.o. female who presents to the Emergency Department complaining of an injury to the right hand sustained just PTA. Pt states she accidentally struck her hand against a metal freezer door while at work. She reports associated pain and swelling to the area. She has not taken anything for pain. She denies any other injuries.   Past Medical History  Diagnosis Date  . Medical history non-contributory    Past Surgical History  Procedure Laterality Date  . No past surgeries     Family History  Problem Relation Age of Onset  . Diabetes Maternal Grandmother   . Heart disease Neg Hx   . Hypertension Neg Hx   . Stroke Neg Hx   . Mental illness Neg Hx   . Drug abuse Neg Hx   . Depression Neg Hx    Social History  Substance Use Topics  . Smoking status: Never Smoker   . Smokeless tobacco: Never Used  . Alcohol Use: No   OB History    Gravida Para Term Preterm AB TAB SAB Ectopic Multiple Living   2 1 1  1 1    1      Review of Systems A complete 10 system review of systems was obtained and all systems are negative except as noted in the HPI and PMH.    Allergies  Review of patient's allergies indicates no known allergies.  Home Medications   Prior to Admission medications   Medication Sig Start Date End Date Taking? Authorizing Provider  methocarbamol (ROBAXIN) 500 MG tablet Take 1 tablet (500 mg total) by mouth 2 (two) times daily. 03/25/15   Hanna Patel-Mills, PA-C  naproxen (NAPROSYN) 500 MG tablet Take 1 tablet (500 mg  total) by mouth 2 (two) times daily. 03/25/15   Hanna Patel-Mills, PA-C  naproxen (NAPROSYN) 500 MG tablet Take 1 tablet (500 mg total) by mouth 2 (two) times daily. 04/16/15   Tatyana Kirichenko, PA-C  traMADol (ULTRAM) 50 MG tablet Take 1 tablet (50 mg total) by mouth every 6 (six) hours as needed. 04/16/15   Tatyana Kirichenko, PA-C   BP 135/52 mmHg  Pulse 86  Temp(Src) 98 F (36.7 C) (Oral)  Resp 18  SpO2 97%  LMP 10/29/2015 (Approximate) Physical Exam  Constitutional: She is oriented to person, place, and time. She appears well-developed and well-nourished. No distress.  HENT:  Head: Normocephalic and atraumatic.  Nose: Nose normal.  Mouth/Throat: Oropharynx is clear and moist. No oropharyngeal exudate.  Eyes: Conjunctivae and EOM are normal. Pupils are equal, round, and reactive to light. No scleral icterus.  Neck: Normal range of motion. Neck supple. No JVD present. No tracheal deviation present. No thyromegaly present.  Cardiovascular: Normal rate, regular rhythm, normal heart sounds and intact distal pulses.  Exam reveals no gallop and no friction rub.   No murmur heard. Pulmonary/Chest: Effort normal and breath sounds normal. No respiratory distress. She has no wheezes. She exhibits no tenderness.  Abdominal: Soft. Bowel sounds are normal. She exhibits no distension  and no mass. There is no tenderness. There is no rebound and no guarding.  Musculoskeletal: Normal range of motion. She exhibits edema and tenderness.  R Hand: 4th and 5th digits taped together, swelling and TTP of the %th distal metacarpal, normal radial and ulnar pulses  Lymphadenopathy:    She has no cervical adenopathy.  Neurological: She is alert and oriented to person, place, and time. No cranial nerve deficit. She exhibits normal muscle tone.  Skin: Skin is warm and dry. No rash noted. No erythema. No pallor.  Nursing note and vitals reviewed.   ED Course  Procedures  DIAGNOSTIC STUDIES: Oxygen Saturation  is 97% on RA, adequate by my interpretation.    COORDINATION OF CARE: 2:43 AM - Discussed XR results and plans to order medication for pain. Will refer to an orthopedic surgeon. Pt advised of plan for treatment and pt agrees.  Labs Review Labs Reviewed - No data to display  Imaging Review Dg Hand Complete Right  11/12/2015  CLINICAL DATA:  Right hand pain and swelling after injury. Struck a metal door work this evening. EXAM: RIGHT HAND - COMPLETE 3+ VIEW COMPARISON:  None. FINDINGS: Minimally displaced angulated distal fifth metacarpal fracture. No intra-articular extension. Associated soft tissue edema. No additional fracture or dislocation of the hand. IMPRESSION: Minimally displaced angulated distal fifth metacarpal fracture. Electronically Signed   By: Rubye Oaks M.D.   On: 11/12/2015 00:57   I have personally reviewed and evaluated these images and lab results as part of my medical decision-making.   EKG Interpretation None      MDM   Final diagnoses:  None   Patient presents to the ED after punching a freezer at work.  Xray shows a boxers fracture.  She was given toradol and percocet for pain control.  Will apply ulnar gutter splint and give ortho follow up.  Advised to take ibuprofen at home and tramadol for breakthrough pain.  Compartment syndrome return precautions provided.    After splint applied she has normal pulses and sensation. She appears well and in NAD.  VS remain within her normal limits and she is safe for DC.   SPLINT APPLICATION Date/Time: 2:52 AM Authorized by: Tomasita Crumble Consent: Verbal consent obtained. Risks and benefits: risks, benefits and alternatives were discussed Consent given by: patient Splint applied by: orthopedic technician Location details: R forearm Splint type: ulnar gutter Supplies used: fiberglass Post-procedure: The splinted body part was neurovascularly unchanged following the procedure. Patient tolerance: Patient tolerated  the procedure well with no immediate complications.       I personally performed the services described in this documentation, which was scribed in my presence. The recorded information has been reviewed and is accurate.     Tomasita Crumble, MD 11/12/15 [phone]

## 2015-11-12 NOTE — ED Notes (Signed)
Ortho tech at bedside 

## 2015-12-10 ENCOUNTER — Encounter (HOSPITAL_COMMUNITY): Payer: Self-pay

## 2016-05-29 ENCOUNTER — Encounter: Payer: Self-pay | Admitting: *Deleted

## 2016-10-08 ENCOUNTER — Emergency Department (HOSPITAL_COMMUNITY)
Admission: EM | Admit: 2016-10-08 | Discharge: 2016-10-08 | Disposition: A | Discharge status: Home / Self Care | Payer: Medicaid Other | Attending: Emergency Medicine | Admitting: Emergency Medicine

## 2016-10-08 ENCOUNTER — Encounter (HOSPITAL_COMMUNITY): Payer: Self-pay

## 2016-10-08 DIAGNOSIS — R091 Pleurisy: Secondary | ICD-10-CM

## 2016-10-08 DIAGNOSIS — R079 Chest pain, unspecified: Secondary | ICD-10-CM

## 2016-10-08 MED ORDER — NAPROXEN 500 MG PO TABS
500.0000 mg | ORAL_TABLET | Freq: Once | ORAL | Status: AC
  Administered 2016-10-08: 500 mg via ORAL
  Filled 2016-10-08: qty 1

## 2016-10-08 MED ORDER — NAPROXEN 500 MG PO TABS: 500.0000 mg | ORAL_TABLET | Freq: Two times a day (BID) | ORAL | 0 refills | Status: DC

## 2016-10-08 NOTE — Discharge Instructions (Signed)
Read the instruction for PE - return if you have worsening chest pain, shortness of breath, blood in the sputum, fainting.

## 2016-10-08 NOTE — ED Triage Notes (Signed)
Chest pain x 3 days.  No cough/congestion or other URI symptoms.  Some SOB.  No cardiac history

## 2016-10-08 NOTE — ED Provider Notes (Signed)
[facility]-EMERGENCY DEPT Provider Note   CSN: [id number] Arrival date & time: 10/08/16  [id number]     History   Chief Complaint Chief Complaint  Patient presents with  . Chest Pain    HPI Sarah Hester is a 21 y.o. female.  HPI  Pt with cc of chest pain. Chest pain is in the midsternal region, it is intermittent and has been present for 3 days. Pt gets this pain 3-4 times daily and she described it as tightness with associated dib. Pt has no specific precipitating factor and she has no specific aggravating or relieving factors. Pain is present for < 1 min usually. Pt has no hx of similar pain. Pt has no medical condition. No family hx of premature CAD, or SLE/Sarcoidosis. Pt has no wheezing or cough. Pt gets mild dyspnea with chest discomfort. Pt has no hx of PE, DVT and denies any exogenous estrogen use, long distance travels or surgery in the past 6 weeks, active cancer, recent immobilization.   Past Medical History:  Diagnosis Date  . Medical history non-contributory     Patient Active Problem List   Diagnosis Date Noted  . Labor and delivery, indication for care 01/13/2014  . Normal delivery 01/13/2014  . Pyuria 06/14/2013  . Abdominal pain 06/14/2013    Past Surgical History:  Procedure Laterality Date  . NO PAST SURGERIES      OB History    Gravida Para Term Preterm AB Living   2 1 1   1 1    SAB TAB Ectopic Multiple Live Births     1     1       Home Medications    Prior to Admission medications   Medication Sig Start Date End Date Taking? Authorizing Provider  Chlorphen-PE-Acetaminophen (CORICIDIN D COLD/FLU/SINUS PO) Take 2 tablets by mouth every 6 (six) hours.   Yes Historical Provider, MD  methocarbamol (ROBAXIN) 500 MG tablet Take 1 tablet (500 mg total) by mouth 2 (two) times daily. Patient not taking: Reported on 10/08/2016 03/25/15   Catha GosselinHanna Patel-Mills, PA-C  naproxen (NAPROSYN) 500 MG tablet Take 1 tablet (500 mg total) by mouth 2 (two) times daily. 10/08/16    Derwood KaplanAnkit Nyssa Sayegh, MD  traMADol (ULTRAM) 50 MG tablet Take 1 tablet (50 mg total) by mouth every 12 (twelve) hours as needed. Patient not taking: Reported on 10/08/2016 11/12/15   Tomasita CrumbleAdeleke Oni, MD    Family History Family History  Problem Relation Age of Onset  . Diabetes Maternal Grandmother   . Heart disease Neg Hx   . Hypertension Neg Hx   . Stroke Neg Hx   . Mental illness Neg Hx   . Drug abuse Neg Hx   . Depression Neg Hx     Social History Social History  Substance Use Topics  . Smoking status: Never Smoker  . Smokeless tobacco: Never Used  . Alcohol use No     Allergies   Patient has no known allergies.   Review of Systems Review of Systems  ROS 10 Systems reviewed and are negative for acute change except as noted in the HPI.     Physical Exam Updated Vital Signs BP 125/71 (BP Location: Left Arm)   Pulse 97   Temp 98.7 F (37.1 C) (Oral)   Resp 16   LMP 09/30/2016   SpO2 100%   Physical Exam  Constitutional: She is oriented to person, place, and time. She appears well-developed and well-nourished.  HENT:  Head: Normocephalic and atraumatic.  Eyes: EOM are normal. Pupils are equal, round, and reactive to light.  Neck: Neck supple.  Cardiovascular: Normal rate, regular rhythm and normal heart sounds.   No murmur heard. Pulmonary/Chest: Effort normal. No respiratory distress.  Abdominal: Soft. She exhibits no distension. There is no tenderness. There is no rebound and no guarding.  Neurological: She is alert and oriented to person, place, and time.  Skin: Skin is warm and dry.  Nursing note and vitals reviewed.    ED Treatments / Results  Labs (all labs ordered are listed, but only abnormal results are displayed) Labs Reviewed - No data to display  EKG  EKG Interpretation  Date/Time:  Friday October 08 2016 09:44:51 EST Ventricular Rate:  96 PR Interval:    QRS Duration: 78 QT Interval:  345 QTC Calculation: 436 R Axis:   84 Text  Interpretation:  Sinus rhythm Consider right atrial enlargement RSR' in V1 or V2, probably normal variant No acute changes No significant change since last tracing Confirmed by Rhunette Croft, MD, Janey Genta [phone]) on 10/08/2016 11:15:42 AM       Radiology No results found.  Procedures Procedures (including critical care time)  Medications Ordered in ED Medications  naproxen (NAPROSYN) tablet 500 mg (not administered)     Initial Impression / Assessment and Plan / ED Course  I have reviewed the triage vital signs and the nursing notes.  Pertinent labs & imaging results that were available during my care of the patient were reviewed by me and considered in my medical decision making (see chart for details).     Pt with chest pain, intermittent - 3/4 times per day lasting few seconds. There is pleuritic component. Pt has RSR in v1- v2, not new.  Pt is PERC neg.  Shared decision made about low suspicion for PE, but the critical nature of the diagnosis. We discussed aggressive approach - with dimer and CT PE if needed VS conservative approach of watch and wait, and to return to the ER if there are any warning signs that we discussed. PT prefers conservative choice. Strict ER return precautions have been discussed, and patient is agreeing with the plan and is comfortable with the workup done and the recommendations from the ER.    Final Clinical Impressions(s) / ED Diagnoses   Final diagnoses:  Nonspecific chest pain  Pleurisy    New Prescriptions Current Discharge Medication List       Derwood Kaplan, MD 10/08/16 1146

## 2017-03-28 ENCOUNTER — Other Ambulatory Visit (HOSPITAL_COMMUNITY): Payer: Self-pay | Admitting: *Deleted

## 2017-03-28 DIAGNOSIS — N6320 Unspecified lump in the left breast, unspecified quadrant: Secondary | ICD-10-CM

## 2017-04-14 ENCOUNTER — Inpatient Hospital Stay: Admission: RE | Admit: 2017-04-14 | Payer: Self-pay | Source: Ambulatory Visit

## 2017-04-14 ENCOUNTER — Ambulatory Visit (HOSPITAL_COMMUNITY): Payer: Self-pay

## 2018-03-24 ENCOUNTER — Encounter (HOSPITAL_BASED_OUTPATIENT_CLINIC_OR_DEPARTMENT_OTHER): Payer: Self-pay | Admitting: *Deleted

## 2018-03-24 ENCOUNTER — Emergency Department (HOSPITAL_BASED_OUTPATIENT_CLINIC_OR_DEPARTMENT_OTHER)
Admission: EM | Admit: 2018-03-24 | Discharge: 2018-03-24 | Disposition: A | Discharge status: Home / Self Care | Payer: No Typology Code available for payment source | Attending: Emergency Medicine | Admitting: Emergency Medicine

## 2018-03-24 ENCOUNTER — Emergency Department (HOSPITAL_BASED_OUTPATIENT_CLINIC_OR_DEPARTMENT_OTHER): Payer: No Typology Code available for payment source

## 2018-03-24 ENCOUNTER — Other Ambulatory Visit: Payer: Self-pay

## 2018-03-24 DIAGNOSIS — Z79899 Other long term (current) drug therapy: Secondary | ICD-10-CM | POA: Insufficient documentation

## 2018-03-24 DIAGNOSIS — Z23 Encounter for immunization: Secondary | ICD-10-CM | POA: Diagnosis not present

## 2018-03-24 DIAGNOSIS — W294XXA Contact with nail gun, initial encounter: Secondary | ICD-10-CM | POA: Diagnosis not present

## 2018-03-24 DIAGNOSIS — S62639B Displaced fracture of distal phalanx of unspecified finger, initial encounter for open fracture: Secondary | ICD-10-CM | POA: Insufficient documentation

## 2018-03-24 DIAGNOSIS — S61012A Laceration without foreign body of left thumb without damage to nail, initial encounter: Secondary | ICD-10-CM | POA: Diagnosis present

## 2018-03-24 DIAGNOSIS — Y939 Activity, unspecified: Secondary | ICD-10-CM | POA: Diagnosis not present

## 2018-03-24 DIAGNOSIS — Y9259 Other trade areas as the place of occurrence of the external cause: Secondary | ICD-10-CM | POA: Insufficient documentation

## 2018-03-24 DIAGNOSIS — Y990 Civilian activity done for income or pay: Secondary | ICD-10-CM | POA: Insufficient documentation

## 2018-03-24 MED ORDER — OXYCODONE-ACETAMINOPHEN 5-325 MG PO TABS
ORAL_TABLET | ORAL | Status: AC
  Filled 2018-03-24: qty 1

## 2018-03-24 MED ORDER — TETANUS-DIPHTH-ACELL PERTUSSIS 5-2.5-18.5 LF-MCG/0.5 IM SUSP
INTRAMUSCULAR | Status: AC
  Filled 2018-03-24: qty 0.5

## 2018-03-24 MED ORDER — TETANUS-DIPHTH-ACELL PERTUSSIS 5-2.5-18.5 LF-MCG/0.5 IM SUSP
0.5000 mL | Freq: Once | INTRAMUSCULAR | Status: AC
  Administered 2018-03-24: 0.5 mL via INTRAMUSCULAR

## 2018-03-24 MED ORDER — LIDOCAINE HCL 1 % IJ SOLN
INTRAMUSCULAR | Status: AC
  Filled 2018-03-24: qty 20

## 2018-03-24 MED ORDER — HYDROCODONE-ACETAMINOPHEN 5-325 MG PO TABS: 1.0000 | ORAL_TABLET | Freq: Four times a day (QID) | ORAL | 0 refills | Status: DC | PRN

## 2018-03-24 MED ORDER — LIDOCAINE HCL 1 % IJ SOLN
INTRAMUSCULAR | Status: AC
  Administered 2018-03-24: 20 mL
  Filled 2018-03-24: qty 20

## 2018-03-24 MED ORDER — CEPHALEXIN 500 MG PO CAPS: 500.0000 mg | ORAL_CAPSULE | Freq: Four times a day (QID) | ORAL | 0 refills | Status: DC

## 2018-03-24 MED ORDER — OXYCODONE-ACETAMINOPHEN 5-325 MG PO TABS
1.0000 | ORAL_TABLET | Freq: Once | ORAL | Status: AC
  Administered 2018-03-24: 1 via ORAL

## 2018-03-24 MED FILL — HYDROCODON-APAP 5-325: 5-325 | 2 days supply | Qty: 12 | Fill #0

## 2018-03-24 MED FILL — CEPHALEXIN 500 MG CAPSULE: 500 | 7 days supply | Qty: 28 | Fill #0

## 2018-03-24 NOTE — ED Triage Notes (Signed)
Pt co left thumb lac from machine at work x 2 hrs ago

## 2018-03-24 NOTE — ED Provider Notes (Signed)
[facility] EMERGENCY DEPARTMENT Provider Note   CSN: [id number] Arrival date & time: 03/24/18  1239     History   Chief Complaint Chief Complaint  Patient presents with  . Laceration    HPI Sarah Hester is a 22 y.o. female.  Who presents for evaluation of left thumb injury.  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  She was seen in urgent care earlier and told her that she had an open fracture and would need her bone set and to come to the emergency department.  She denies any numbness or tingling.  She is right-hand dominant  HPI  Past Medical History:  Diagnosis Date  . Medical history non-contributory     Patient Active Problem List   Diagnosis Date Noted  . Labor and delivery, indication for care 01/13/2014  . Normal delivery 01/13/2014  . Pyuria 06/14/2013  . Abdominal pain 06/14/2013    Past Surgical History:  Procedure Laterality Date  . NO PAST SURGERIES       OB History    Gravida  2   Para  1   Term  1   Preterm      AB  1   Living  1     SAB      TAB  1   Ectopic      Multiple      Live Births  1            Home Medications    Prior to Admission medications   Medication Sig Start Date End Date Taking? Authorizing Provider  Chlorphen-PE-Acetaminophen (CORICIDIN D COLD/FLU/SINUS PO) Take 2 tablets by mouth every 6 (six) hours.    [provider]  naproxen (NAPROSYN) 500 MG tablet Take 1 tablet (500 mg total) by mouth 2 (two) times daily. 10/08/16   Derwood Kaplan, MD  traMADol (ULTRAM) 50 MG tablet Take 1 tablet (50 mg total) by mouth every 12 (twelve) hours as needed. Patient not taking: Reported on 10/08/2016 11/12/15   Tomasita Crumble, MD    Family History Family History  Problem Relation Age of Onset  . Diabetes Maternal Grandmother   . Heart disease Neg Hx   . Hypertension Neg Hx   . Stroke Neg Hx   . Mental illness Neg Hx   . Drug abuse Neg Hx   .  Depression Neg Hx     Social History Social History   Tobacco Use  . Smoking status: Never Smoker  . Smokeless tobacco: Never Used  Substance Use Topics  . Alcohol use: No  . Drug use: No     Allergies   Patient has no known allergies.   Review of Systems Review of Systems Ten systems reviewed and are negative for acute change, except as noted in the HPI.    Physical Exam Updated Vital Signs BP 134/89 (BP Location: Right Arm)   Pulse 74   Temp 98.6 F (37 C) (Oral)   Resp 20   Ht 5\' 6"  (1.676 m)   Wt 68 kg (150 lb)   LMP 03/11/2018   SpO2 100%   BMI 24.21 kg/m   Physical Exam       ED Treatments / Results  Labs (all labs ordered are listed, but only abnormal results are displayed) Labs Reviewed - No data to display  EKG None  Radiology Dg Finger Thumb Left  Result Date: 03/24/2018 CLINICAL DATA:  Thumb  injury EXAM: LEFT THUMB 2+V COMPARISON:  None. FINDINGS: Fracture of the tuft of the distal first phalanx with small fracture fragment in the soft tissues in adjacent laceration IMPRESSION: Open fracture tuft of the first distal phalanx Electronically Signed   By: Marlan Palauharles  Clark M.D.   On: 03/24/2018 13:18    Procedures .Marland Kitchen.Laceration Repair Date/Time: 03/24/2018 7:26 PM Performed by: Arthor CaptainHarris, Mashawn Brazil, PA-C Authorized by: Arthor CaptainHarris, Bridie Colquhoun, PA-C   Consent:    Consent obtained:  Verbal   Consent given by:  Patient   Risks discussed:  Infection, pain, poor wound healing and need for additional repair   Alternatives discussed:  No treatment and referral Anesthesia (see MAR for exact dosages):    Anesthesia method:  Nerve block   Block needle gauge:  25 G   Block anesthetic:  Lidocaine 1% w/o epi   Block technique:  7   Block injection procedure:  Anatomic landmarks identified   Block outcome:  Anesthesia achieved Laceration details:    Location:  Finger   Finger location:  L thumb   Length (cm):  3   Laceration depth: through and through. Repair  type:    Repair type:  Intermediate Exploration:    Wound extent: underlying fracture     Wound extent: no foreign bodies/material noted and no vascular damage noted   Treatment:    Area cleansed with:  Betadine   Amount of cleaning:  Standard   Irrigation solution:  Sterile water   Irrigation method:  Pressure wash Skin repair:    Repair method:  Sutures   Suture size:  4-0   Wound skin closure material used: 4.0 Vicryl.   Suture technique:  Simple interrupted   Number of sutures:  12 Approximation:    Approximation:  Close Post-procedure details:    Dressing:  Non-adherent dressing   Patient tolerance of procedure:  Tolerated well, no immediate complications  SPLINT APPLICATION Date/Time: 7:33 PM Authorized by: Arthor CaptainAbigail Harrington Jobe Consent: Verbal consent obtained. Risks and benefits: risks, benefits and alternatives were discussed Consent given by: patient Splint applied by: orthopedic technician Location details: Left thumb Splint type: static finger  Supplies used: prefab finger splint Post-procedure: The splinted body part was neurovascularly unchanged following the procedure. Patient tolerance: Patient tolerated the procedure well with no immediate complications.      (including critical care time)  Medications Ordered in ED Medications  Tdap (BOOSTRIX) injection 0.5 mL (0.5 mLs Intramuscular Given 03/24/18 1306)  oxyCODONE-acetaminophen (PERCOCET/ROXICET) 5-325 MG per tablet 1 tablet (1 tablet Oral Given 03/24/18 1307)  lidocaine (XYLOCAINE) 1 % (with pres) injection (20 mLs  Given 03/24/18 1307)     Initial Impression / Assessment and Plan / ED Course  I have reviewed the triage vital signs and the nursing notes.  Pertinent labs & imaging results that were available during my care of the patient were reviewed by me and considered in my medical decision making (see chart for details).     With open finger fracture . Washed thoroughly, sterile technique utilized  for repair.  Patient started on Keflex.  Her tetanus has been updated.  Placed in splint.  She will need follow-up with hand surgeon.  Patient appears appropriate for discharge at this time.  She is neurovascularly intact.  Final Clinical Impressions(s) / ED Diagnoses   Final diagnoses:  Open fracture of tuft of distal phalanx of finger    ED Discharge Orders    None       Arthor CaptainHarris, Salome Cozby, PA-C 03/24/18 1935  Gwyneth Sprout, MD 03/25/18 [phone]

## 2018-03-24 NOTE — Discharge Instructions (Addendum)
WOUND CARE ° ° Keep area clean and dry for 24 hours. Do not remove °bandage, if applied. ° After 24 hours, remove bandage and wash wound °gently with mild soap and warm water. Reapply °a new bandage after cleaning wound, if directed. ° Continue daily cleansing with soap and water until °stitches/staples are removed. ° Do not apply any ointments or creams to the wound °while stitches/staples are in place, as this may cause °delayed healing. ° Seek medical careif you experience any of the following °signs of infection: Swelling, redness, pus drainage, °streaking, fever >101.0 F ° Seek care if you experience excessive bleeding °that does not stop after 15-20 minutes of constant, firm °pressure. ° ° °

## 2018-03-24 NOTE — ED Notes (Signed)
ED Provider at bedside. 

## 2018-03-24 NOTE — ED Notes (Signed)
Patient transported to X-ray 

## 2019-11-22 ENCOUNTER — Emergency Department (HOSPITAL_BASED_OUTPATIENT_CLINIC_OR_DEPARTMENT_OTHER)
Admission: EM | Admit: 2019-11-22 | Discharge: 2019-11-22 | Disposition: A | Discharge status: Home / Self Care | Payer: Self-pay | Attending: Emergency Medicine | Admitting: Emergency Medicine

## 2019-11-22 ENCOUNTER — Encounter (HOSPITAL_BASED_OUTPATIENT_CLINIC_OR_DEPARTMENT_OTHER): Payer: Self-pay | Admitting: Emergency Medicine

## 2019-11-22 ENCOUNTER — Other Ambulatory Visit: Payer: Self-pay

## 2019-11-22 ENCOUNTER — Emergency Department (HOSPITAL_BASED_OUTPATIENT_CLINIC_OR_DEPARTMENT_OTHER): Payer: Self-pay

## 2019-11-22 DIAGNOSIS — W19XXXA Unspecified fall, initial encounter: Secondary | ICD-10-CM

## 2019-11-22 DIAGNOSIS — Y929 Unspecified place or not applicable: Secondary | ICD-10-CM | POA: Insufficient documentation

## 2019-11-22 DIAGNOSIS — Y9301 Activity, walking, marching and hiking: Secondary | ICD-10-CM | POA: Insufficient documentation

## 2019-11-22 DIAGNOSIS — Y999 Unspecified external cause status: Secondary | ICD-10-CM | POA: Insufficient documentation

## 2019-11-22 DIAGNOSIS — S92254A Nondisplaced fracture of navicular [scaphoid] of right foot, initial encounter for closed fracture: Secondary | ICD-10-CM | POA: Insufficient documentation

## 2019-11-22 DIAGNOSIS — W010XXA Fall on same level from slipping, tripping and stumbling without subsequent striking against object, initial encounter: Secondary | ICD-10-CM | POA: Insufficient documentation

## 2019-11-22 NOTE — ED Provider Notes (Signed)
[facility] EMERGENCY DEPARTMENT Provider Note   CSN: [id number] Arrival date & time: 11/22/19  1154     History Chief Complaint  Patient presents with  . Fall    Sarah Hester is a 24 y.o. female.  She is complaining of severe right foot pain after a slip and fall yesterday.  No other injuries or complaints.  Worse with ambulation.  No numbness or weakness.  The history is provided by the patient.  Foot Injury Location:  Foot Time since incident:  24 hours Injury: yes   Foot location:  R foot Pain details:    Quality:  Throbbing   Radiates to:  Does not radiate   Severity:  Severe   Onset quality:  Sudden   Timing:  Constant   Progression:  Unchanged Chronicity:  New Dislocation: no   Prior injury to area:  No Relieved by:  Nothing Worsened by:  Bearing weight Ineffective treatments:  Rest Associated symptoms: swelling   Associated symptoms: no back pain, no fever and no numbness        Past Medical History:  Diagnosis Date  . Medical history non-contributory     Patient Active Problem List   Diagnosis Date Noted  . Labor and delivery, indication for care 01/13/2014  . Normal delivery 01/13/2014  . Pyuria 06/14/2013  . Abdominal pain 06/14/2013    Past Surgical History:  Procedure Laterality Date  . NO PAST SURGERIES       OB History    Gravida  2   Para  1   Term  1   Preterm      AB  1   Living  1     SAB      TAB  1   Ectopic      Multiple      Live Births  1           Family History  Problem Relation Age of Onset  . Diabetes Maternal Grandmother   . Heart disease Neg Hx   . Hypertension Neg Hx   . Stroke Neg Hx   . Mental illness Neg Hx   . Drug abuse Neg Hx   . Depression Neg Hx     Social History   Tobacco Use  . Smoking status: Never Smoker  . Smokeless tobacco: Never Used  Substance Use Topics  . Alcohol use: No  . Drug use: No    Home Medications Prior to Admission medications     Medication Sig Start Date End Date Taking? Authorizing Provider  cephALEXin (KEFLEX) 500 MG capsule Take 1 capsule (500 mg total) by mouth 4 (four) times daily. 03/24/18   Harris, Cammy Copa, PA-C  Chlorphen-PE-Acetaminophen (CORICIDIN D COLD/FLU/SINUS PO) Take 2 tablets by mouth every 6 (six) hours.    [provider]  HYDROcodone-acetaminophen (NORCO) 5-325 MG tablet Take 1-2 tablets by mouth every 6 (six) hours as needed for moderate pain. 03/24/18   Harris, Cammy Copa, PA-C  naproxen (NAPROSYN) 500 MG tablet Take 1 tablet (500 mg total) by mouth 2 (two) times daily. 10/08/16   Derwood Kaplan, MD  traMADol (ULTRAM) 50 MG tablet Take 1 tablet (50 mg total) by mouth every 12 (twelve) hours as needed. Patient not taking: Reported on 10/08/2016 11/12/15   Tomasita Crumble, MD    Allergies    Patient has no known allergies.  Review of Systems   Review of Systems  Constitutional: Negative for fever.  Musculoskeletal: Negative for back pain.  Skin:  Negative for wound.  Neurological: Negative for weakness and numbness.    Physical Exam Updated Vital Signs BP (!) 138/91 (BP Location: Right Arm)   Pulse (!) 107   Temp 98.4 F (36.9 C) (Oral)   Resp 18   Ht 5\' 6"  (1.676 m)   Wt 64.4 kg   LMP 11/11/2019   SpO2 100%   BMI 22.92 kg/m   Physical Exam Constitutional:      Appearance: She is well-developed.  HENT:     Head: Normocephalic and atraumatic.  Eyes:     Conjunctiva/sclera: Conjunctivae normal.  Musculoskeletal:        General: Swelling, tenderness and signs of injury present.     Cervical back: Neck supple.     Comments: Right lower extremity nontender hip knee and ankle.  No calcaneal tenderness.  Diffusely tender midfoot.  Able to move digits.  Cap refill brisk.  Sensation intact to light touch.  DP pulse 2+.  Skin:    General: Skin is warm and dry.     Capillary Refill: Capillary refill takes less than 2 seconds.  Neurological:     General: No focal deficit present.      Mental Status: She is alert.     GCS: GCS eye subscore is 4. GCS verbal subscore is 5. GCS motor subscore is 6.     Sensory: No sensory deficit.     Motor: No weakness.     ED Results / Procedures / Treatments   Labs (all labs ordered are listed, but only abnormal results are displayed) Labs Reviewed - No data to display  EKG None  Radiology DG Foot Complete Right  Result Date: 11/22/2019 CLINICAL DATA:  Right foot pain after fall EXAM: RIGHT FOOT COMPLETE - 3+ VIEW COMPARISON:  None. FINDINGS: Cortical lucency through the medial aspect of the navicular may reflect a nondisplaced fracture or a partially fused os naviculare. Relatively mild soft tissue swelling at the medial aspect the midfoot. Remaining osseous structures appear intact. No malalignment. IMPRESSION: Cortical lucency through the medial aspect of the navicular may reflect a nondisplaced fracture or a partially fused os naviculare. Correlate with point tenderness. Electronically Signed   By: Davina Poke D.O.   On: 11/22/2019 12:26    Procedures Procedures (including critical care time)  Medications Ordered in ED Medications - No data to display  ED Course  I have reviewed the triage vital signs and the nursing notes.  Pertinent labs & imaging results that were available during my care of the patient were reviewed by me and considered in my medical decision making (see chart for details).  Clinical Course as of Nov 21 1800  Thu Nov 22, 2019  1232 Differential includes fracture, dislocation, contusion, sprain   [MB]  1312 Discussed with Dr. Griffin Basil orthopedics consultant.  He agrees with short cam boot and crutches and follow-up with him in a week or so.   [MB]    Clinical Course User Index [MB] Hayden Rasmussen, MD   MDM Rules/Calculators/A&P                      Final Clinical Impression(s) / ED Diagnoses Final diagnoses:  Fall, initial encounter  Closed nondisplaced fracture of navicular bone of  right foot, initial encounter    Rx / DC Orders ED Discharge Orders    None       Hayden Rasmussen, MD 11/22/19 [phone]

## 2019-11-22 NOTE — ED Triage Notes (Signed)
Slipped on wet floor and fell yesterday. C/o R foot and ankle pain. Swelling noted.

## 2019-11-22 NOTE — Discharge Instructions (Addendum)
You were seen in the emergency department for evaluation of right foot pain after a fall.  Your x-ray showed a possible navicular fracture which is a bone in the midfoot.  Please contact Dr. Austin Miles office for close follow-up.  Boot and crutches to keep foot elevated.  May be removed for bathing.  Nonweightbearing until follow-up with orthopedics.

## 2020-04-24 ENCOUNTER — Encounter (HOSPITAL_COMMUNITY): Payer: Self-pay | Admitting: Emergency Medicine

## 2020-04-24 ENCOUNTER — Other Ambulatory Visit: Payer: Self-pay

## 2020-04-24 ENCOUNTER — Emergency Department (HOSPITAL_COMMUNITY): Payer: Self-pay

## 2020-04-24 ENCOUNTER — Emergency Department (HOSPITAL_COMMUNITY)
Admission: EM | Admit: 2020-04-24 | Discharge: 2020-04-24 | Disposition: A | Discharge status: Home / Self Care | Payer: Self-pay | Attending: Emergency Medicine | Admitting: Emergency Medicine

## 2020-04-24 DIAGNOSIS — Y929 Unspecified place or not applicable: Secondary | ICD-10-CM | POA: Insufficient documentation

## 2020-04-24 DIAGNOSIS — Y93I9 Activity, other involving external motion: Secondary | ICD-10-CM | POA: Insufficient documentation

## 2020-04-24 DIAGNOSIS — M7918 Myalgia, other site: Secondary | ICD-10-CM

## 2020-04-24 DIAGNOSIS — M25512 Pain in left shoulder: Secondary | ICD-10-CM | POA: Insufficient documentation

## 2020-04-24 DIAGNOSIS — M79601 Pain in right arm: Secondary | ICD-10-CM | POA: Insufficient documentation

## 2020-04-24 DIAGNOSIS — Y999 Unspecified external cause status: Secondary | ICD-10-CM | POA: Insufficient documentation

## 2020-04-24 DIAGNOSIS — M542 Cervicalgia: Secondary | ICD-10-CM | POA: Insufficient documentation

## 2020-04-24 DIAGNOSIS — Z79899 Other long term (current) drug therapy: Secondary | ICD-10-CM | POA: Insufficient documentation

## 2020-04-24 MED ORDER — METHOCARBAMOL 500 MG PO TABS: 500.0000 mg | ORAL_TABLET | Freq: Two times a day (BID) | ORAL | 0 refills | Status: DC
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
## 2020-04-24 NOTE — Discharge Instructions (Signed)

## 2020-04-24 NOTE — ED Notes (Signed)
An After Visit Summary was printed and given to the patient. Discharge instructions given and no further questions at this time.  
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
## 2020-09-06 NOTE — L&D Delivery Note (Signed)
[facility] Delivery Note  Sarah Hester is a 25 y.o. G3P1011 s/p SVB at [redacted]w[redacted]d. She was admitted for labor/ROM.   ROM: SROM at 2145 07/22/21 with clear fluid GBS Status: neg  Labor Progress: [name] was admitted in latent labor with SROM. She was 3cm on admission and progressed to SVB within 2 hours.  Delivery Date/Time: July 23, 2021 at 0043 Delivery: Called to room and patient was complete and pushing. Head delivered ROA. No nuchal cord present. Shoulder and body delivered in usual fashion. Infant with spontaneous cry, placed on mother's abdomen, dried and stimulated. Cord clamped x 2 after 1-minute delay, and cut by mother of pt. Cord blood drawn. Placenta delivered spontaneously with gentle cord traction. Fundus firm with massage and Pitocin. Labia, perineum, vagina, and cervix inspected and found to be intact.   Placenta: spont, intact; to L&D Complications: none Lacerations: none EBL: 50cc Analgesia: none  Postpartum Planning [x]  message to sent to schedule follow-up   Infant: girl  APGARs 9/9  3147g (6lb 15oz)  11/9, CNM  07/23/2021 4:37 AM

## 2021-02-24 ENCOUNTER — Encounter (HOSPITAL_COMMUNITY): Payer: Self-pay | Admitting: Emergency Medicine

## 2021-02-24 ENCOUNTER — Ambulatory Visit (HOSPITAL_COMMUNITY)
Admission: EM | Admit: 2021-02-24 | Discharge: 2021-02-24 | Disposition: A | Discharge status: Home / Self Care | Payer: Medicaid Other | Attending: Urgent Care | Admitting: Urgent Care

## 2021-02-24 ENCOUNTER — Other Ambulatory Visit: Payer: Self-pay

## 2021-02-24 DIAGNOSIS — L299 Pruritus, unspecified: Secondary | ICD-10-CM

## 2021-02-24 DIAGNOSIS — T63461A Toxic effect of venom of wasps, accidental (unintentional), initial encounter: Secondary | ICD-10-CM | POA: Diagnosis not present

## 2021-02-24 DIAGNOSIS — R2231 Localized swelling, mass and lump, right upper limb: Secondary | ICD-10-CM

## 2021-02-24 HISTORY — DX: Encounter for supervision of normal pregnancy, unspecified, unspecified trimester: Z34.90

## 2021-02-24 MED ORDER — CETIRIZINE HCL 10 MG PO TABS: 10.0000 mg | ORAL_TABLET | Freq: Every day | ORAL | 0 refills | Status: DC

## 2021-02-24 MED ORDER — CEPHALEXIN 500 MG PO CAPS: 500.0000 mg | ORAL_CAPSULE | Freq: Three times a day (TID) | ORAL | 0 refills | Status: DC

## 2021-02-24 MED ORDER — TRIAMCINOLONE ACETONIDE 0.1 % EX CREA: 1.0000 "application " | TOPICAL_CREAM | Freq: Two times a day (BID) | CUTANEOUS | 0 refills | Status: DC

## 2021-02-24 NOTE — ED Provider Notes (Addendum)
[facility]   MRN: [id number] DOB: 10-17-1995  Subjective:   Sarah Hester is a 25 y.o. female presenting for 3-day history of worsening right forearm swelling, redness.  Patient was stung by a wasp on Sunday and has since been applying hydrocortisone cream but the area is getting worse.  Was primarily itchy but is not painful.  No fever, drainage of pus or bleeding.  Her Tdap is up-to-date.  No current facility-administered medications for this encounter.  Current Outpatient Medications:    cephALEXin (KEFLEX) 500 MG capsule, Take 1 capsule (500 mg total) by mouth 4 (four) times daily. (Patient not taking: Reported on 02/24/2021), Disp: 28 capsule, Rfl: 0   Chlorphen-PE-Acetaminophen (CORICIDIN D COLD/FLU/SINUS PO), Take 2 tablets by mouth every 6 (six) hours. (Patient not taking: Reported on 02/24/2021), Disp: , Rfl:    HYDROcodone-acetaminophen (NORCO) 5-325 MG tablet, Take 1-2 tablets by mouth every 6 (six) hours as needed for moderate pain. (Patient not taking: Reported on 02/24/2021), Disp: 12 tablet, Rfl: 0   methocarbamol (ROBAXIN) 500 MG tablet, Take 1 tablet (500 mg total) by mouth 2 (two) times daily. (Patient not taking: Reported on 02/24/2021), Disp: 20 tablet, Rfl: 0   naproxen (NAPROSYN) 500 MG tablet, Take 1 tablet (500 mg total) by mouth 2 (two) times daily. (Patient not taking: Reported on 02/24/2021), Disp: 30 tablet, Rfl: 0   traMADol (ULTRAM) 50 MG tablet, Take 1 tablet (50 mg total) by mouth every 12 (twelve) hours as needed. (Patient not taking: Reported on 10/08/2016), Disp: 10 tablet, Rfl: 0   No Known Allergies  Past Medical History:  Diagnosis Date   Medical history non-contributory    Pregnant      Past Surgical History:  Procedure Laterality Date   NO PAST SURGERIES      Family History  Problem Relation Age of Onset   Diabetes Maternal Grandmother    Heart disease Neg Hx    Hypertension Neg Hx    Stroke Neg Hx    Mental illness Neg Hx     Drug abuse Neg Hx    Depression Neg Hx     Social History   Tobacco Use   Smoking status: Never   Smokeless tobacco: Never  Vaping Use   Vaping Use: Never used  Substance Use Topics   Alcohol use: No   Drug use: No    ROS   Objective:   Vitals: Pulse 80   Temp 98.5 F (36.9 C) (Oral)   Resp 20   LMP 10/22/2020   SpO2 99%   Physical Exam Constitutional:      General: She is not in acute distress.    Appearance: Normal appearance. She is well-developed. She is not ill-appearing, toxic-appearing or diaphoretic.  HENT:     Head: Normocephalic and atraumatic.     Nose: Nose normal.     Mouth/Throat:     Mouth: Mucous membranes are moist.     Comments: Airway is patent.  No oral or facial swelling. Eyes:     General: No scleral icterus.    Extraocular Movements: Extraocular movements intact.     Pupils: Pupils are equal, round, and reactive to light.  Cardiovascular:     Rate and Rhythm: Normal rate and regular rhythm.     Pulses: Normal pulses.     Heart sounds: Normal heart sounds. No murmur heard.   No friction rub. No gallop.  Pulmonary:     Effort: Pulmonary effort is normal. No  respiratory distress.     Breath sounds: Normal breath sounds. No stridor. No wheezing, rhonchi or rales.  Musculoskeletal:     Right forearm: Swelling and tenderness present. No edema, deformity, lacerations or bony tenderness.       Arms:  Skin:    General: Skin is warm and dry.     Findings: No rash.  Neurological:     General: No focal deficit present.     Mental Status: She is alert and oriented to person, place, and time.  Psychiatric:        Mood and Affect: Mood normal.        Behavior: Behavior normal.        Thought Content: Thought content normal.        Judgment: Judgment normal.      Assessment and Plan :   PDMP not reviewed this encounter.  1. Wasp sting, accidental or unintentional, initial encounter   2. Localized swelling of right forearm   3.  Itching     Patient is pregnant and therefore will avoid any systemic steroids.  Recommended triamcinolone to address local reaction.  Zyrtec for itching.  Keflex for what I suspect is developing cellulitis.  Tylenol for pain control.  Follow-up with obstetrician. Counseled patient on potential for adverse effects with medications prescribed/recommended today, ER and return-to-clinic precautions discussed, patient verbalized understanding.     Wallis Bamberg, [place] 02/24/21 [phone]

## 2021-02-24 NOTE — Discharge Instructions (Addendum)
Cephalexin is to cover for infection from the wasp sting. You can use Zyrtec for itching. Triamcinolone cream is for local skin reaction to the sting. Apply light layers twice daily for up to 1 week then stop.

## 2021-02-24 NOTE — ED Triage Notes (Signed)
Stung by wasp on Sunday.  Stung on right forearm.  Area is red, swollen, itching.  Has used hydrocortisone on area.  Patient is pregnant

## 2021-07-07 ENCOUNTER — Encounter (HOSPITAL_COMMUNITY): Payer: Self-pay | Admitting: Obstetrics & Gynecology

## 2021-07-07 ENCOUNTER — Inpatient Hospital Stay (HOSPITAL_COMMUNITY)
Admission: AD | Admit: 2021-07-07 | Discharge: 2021-07-07 | Disposition: A | Discharge status: Home / Self Care | Payer: Medicaid Other | Attending: Obstetrics & Gynecology | Admitting: Obstetrics & Gynecology

## 2021-07-07 ENCOUNTER — Other Ambulatory Visit: Payer: Self-pay

## 2021-07-07 DIAGNOSIS — O98313 Other infections with a predominantly sexual mode of transmission complicating pregnancy, third trimester: Secondary | ICD-10-CM | POA: Diagnosis not present

## 2021-07-07 DIAGNOSIS — O23599 Infection of other part of genital tract in pregnancy, unspecified trimester: Secondary | ICD-10-CM | POA: Diagnosis not present

## 2021-07-07 DIAGNOSIS — A5901 Trichomonal vulvovaginitis: Secondary | ICD-10-CM | POA: Diagnosis not present

## 2021-07-07 DIAGNOSIS — O4693 Antepartum hemorrhage, unspecified, third trimester: Secondary | ICD-10-CM | POA: Diagnosis present

## 2021-07-07 DIAGNOSIS — Z3A36 36 weeks gestation of pregnancy: Secondary | ICD-10-CM | POA: Diagnosis not present

## 2021-07-07 DIAGNOSIS — O23593 Infection of other part of genital tract in pregnancy, third trimester: Secondary | ICD-10-CM

## 2021-07-07 LAB — URINALYSIS, ROUTINE W REFLEX MICROSCOPIC
Bacteria, UA: NONE SEEN
Bilirubin Urine: NEGATIVE
Glucose, UA: NEGATIVE mg/dL
Ketones, ur: NEGATIVE mg/dL
Nitrite: NEGATIVE
Protein, ur: NEGATIVE mg/dL
Specific Gravity, Urine: 1.004 — ABNORMAL LOW (ref 1.005–1.030)
pH: 6 (ref 5.0–8.0)

## 2021-07-07 LAB — WET PREP, GENITAL
Clue Cells Wet Prep HPF POC: NONE SEEN
Sperm: NONE SEEN
Yeast Wet Prep HPF POC: NONE SEEN

## 2021-07-07 MED ORDER — METRONIDAZOLE 500 MG PO TABS
2000.0000 mg | ORAL_TABLET | Freq: Once | ORAL | Status: AC
  Administered 2021-07-07: 2000 mg via ORAL
  Filled 2021-07-07: qty 4

## 2021-07-07 NOTE — MAU Provider Note (Signed)
History     CSN: [id number]  Arrival date and time: 07/07/21 0757   Event Date/Time   First Provider Initiated Contact with Patient 07/07/21 [phone]      Chief Complaint  Patient presents with   Vaginal Bleeding   HPI Sarah Hester is a 25 y.o. G3P1011 at [redacted]w[redacted]d who presents with vaginal bleeding.  Symptoms started last night with pink spotting.  Noticed an increase in white vaginal discharge, but denies any vaginal irritation.  This morning saw bright red blood on toilet paper.  Not bleeding into a pad.  Denies abdominal pain or loss of fluid.  Reports constant low back pain for the last 2 weeks that was addressed by her ob. Reports good fetal movement.  No recent intercourse or exams. Denies complications with this pregnancy.  Has been getting care with [facility] in [facility] but recently moved to [place].  OB History     Gravida  3   Para  1   Term  1   Preterm      AB  1   Living  1      SAB      IAB  1   Ectopic      Multiple      Live Births  1           Past Medical History:  Diagnosis Date   Medical history non-contributory     Past Surgical History:  Procedure Laterality Date   NO PAST SURGERIES      Family History  Problem Relation Age of Onset   Diabetes Maternal Grandmother    Heart disease Neg Hx    Hypertension Neg Hx    Stroke Neg Hx    Mental illness Neg Hx    Drug abuse Neg Hx    Depression Neg Hx     Social History   Tobacco Use   Smoking status: Never   Smokeless tobacco: Never  Vaping Use   Vaping Use: Never used  Substance Use Topics   Alcohol use: No   Drug use: No    Allergies: No Known Allergies  No medications prior to admission.    Review of Systems  Constitutional: Negative.   Gastrointestinal: Negative.   Genitourinary:  Positive for vaginal bleeding and vaginal discharge. Negative for dysuria.  Musculoskeletal:  Positive for back pain.  Physical Exam   Blood pressure 118/65, pulse 93,  temperature 98.3 F (36.8 C), temperature source Oral, resp. rate 16, height 5\' 6"  (1.676 m), weight 83.5 kg, last menstrual period 10/22/2020, SpO2 100 %, unknown if currently breastfeeding.  Physical Exam Vitals and nursing note reviewed. Exam conducted with a chaperone present.  Constitutional:      Appearance: Normal appearance. She is not ill-appearing.  HENT:     Head: Normocephalic and atraumatic.  Eyes:     General: No scleral icterus. Pulmonary:     Effort: Pulmonary effort is normal. No respiratory distress.  Abdominal:     Palpations: Abdomen is soft.     Tenderness: There is no abdominal tenderness.  Genitourinary:    Comments: Spec exam: Vagina erythematous. No blood. Friable cervix. Scant amount of tan discharge.   Dilation: Closed (ext 1 cm) Effacement (%): Thick Cervical Position: Posterior Station: -3 Exam by:: [email or web address] NPext  Skin:    General: Skin is warm and dry.  Neurological:     General: No focal deficit present.     Mental Status: She is alert.  Psychiatric:  Mood and Affect: Mood normal.        Behavior: Behavior normal.   NST:  Baseline: 140 bpm, Variability: Good {> 6 bpm), Accelerations: Reactive, and Decelerations: Absent  MAU Course  Procedures Results for orders placed or performed during the hospital encounter of 07/07/21 (from the past 24 hour(s))  Urinalysis, Routine w reflex microscopic Urine, Clean Catch     Status: Abnormal   Collection Time: 07/07/21  8:16 AM  Result Value Ref Range   Color, Urine STRAW (A) YELLOW   APPearance CLEAR CLEAR   Specific Gravity, Urine 1.004 (L) 1.005 - 1.030   pH 6.0 5.0 - 8.0   Glucose, UA NEGATIVE NEGATIVE mg/dL   Hgb urine dipstick MODERATE (A) NEGATIVE   Bilirubin Urine NEGATIVE NEGATIVE   Ketones, ur NEGATIVE NEGATIVE mg/dL   Protein, ur NEGATIVE NEGATIVE mg/dL   Nitrite NEGATIVE NEGATIVE   Leukocytes,Ua TRACE (A) NEGATIVE   RBC / HPF 0-5 0 - 5 RBC/hpf   WBC, UA 0-5 0 - 5 WBC/hpf    Bacteria, UA NONE SEEN NONE SEEN   Squamous Epithelial / LPF 0-5 0 - 5  Wet prep, genital     Status: Abnormal   Collection Time: 07/07/21  8:36 AM  Result Value Ref Range   Yeast Wet Prep HPF POC NONE SEEN NONE SEEN   Trich, Wet Prep PRESENT (A) NONE SEEN   Clue Cells Wet Prep HPF POC NONE SEEN NONE SEEN   WBC, Wet Prep HPF POC MANY (A) NONE SEEN   Sperm NONE SEEN     MDM Patient presents with vaginal spotting.  She is Rh+.  On exam, there is no blood but she does have a friable cervix.  Vaginal swabs collected.  Reactive tracing and no contractions.  Cervix is closed and thick.  Wet prep + for trichomonas. Treated in MAU.  Assessment and Plan   1. Trichomonal vaginitis during pregnancy in third trimester   2. [redacted] weeks gestation of pregnancy    -partner to go to [facility] for treatment & no intercourse x 2 weeks -message to office for patient to be transferred to [facility] since she now plans on delivering at [facility] -reviewed reasons to return to MAU -GC/CT pending  Judeth Horn 07/07/2021, 11:34 AM

## 2021-07-07 NOTE — MAU Note (Signed)
Urine in lab 

## 2021-07-07 NOTE — MAU Note (Signed)
Presents with c/o spotting yesterday and dark red VB today.  Denies recent intercourse.  Endorses +FM.  Denies LOF, just white vaginal discharge.

## 2021-07-08 LAB — GC/CHLAMYDIA PROBE AMP (~~LOC~~) NOT AT ARMC
Chlamydia: NEGATIVE
Comment: NEGATIVE
Comment: NORMAL
Neisseria Gonorrhea: NEGATIVE

## 2021-07-09 ENCOUNTER — Other Ambulatory Visit: Payer: Self-pay

## 2021-07-09 ENCOUNTER — Encounter: Payer: Self-pay | Admitting: Obstetrics and Gynecology

## 2021-07-09 ENCOUNTER — Ambulatory Visit (INDEPENDENT_AMBULATORY_CARE_PROVIDER_SITE_OTHER): Payer: Medicaid Other | Admitting: Obstetrics and Gynecology

## 2021-07-09 VITALS — BP 117/73 | HR 94 | Temp 98.0°F | Wt 183.2 lb

## 2021-07-09 DIAGNOSIS — Z3A37 37 weeks gestation of pregnancy: Secondary | ICD-10-CM

## 2021-07-09 DIAGNOSIS — Z3480 Encounter for supervision of other normal pregnancy, unspecified trimester: Secondary | ICD-10-CM | POA: Diagnosis not present

## 2021-07-09 DIAGNOSIS — O23593 Infection of other part of genital tract in pregnancy, third trimester: Secondary | ICD-10-CM | POA: Diagnosis not present

## 2021-07-09 DIAGNOSIS — A5901 Trichomonal vulvovaginitis: Secondary | ICD-10-CM | POA: Diagnosis not present

## 2021-07-09 NOTE — Patient Instructions (Signed)
Please remember that if you are not able to keep your appointment to call the office and reschedule, or cancel. If you no-show or cancel with less than 24 hour notice we will charge you, not your insurance a $50 fee.   AREA PEDIATRIC/FAMILY PRACTICE PHYSICIANS  [facility] [address] [place], [place] [address] Phone - [phone]   Fax - [phone]  Sarah Hester [address] [place], [place]  [address] Phone - [phone]   Fax - [phone]  [facility] [address] [place], [place]  [address] Phone - [phone]   Fax - [phone]  [facility] [address] [place], [place]  [address] Phone - [phone]   Fax - [phone]  [facility] [address] [place], [place]  [address] Phone - [phone]   Fax - [phone]  [facility] [address] [place], [place]  [address] Phone - [phone]   Fax - [phone]  [facility] [address] [place], [place]  [address] Phone - [phone]   Fax - [phone]  [facility] AT [facility] [address] [place], [address] [place], [place]  [address] Phone - [phone]   Fax - [phone]  [facility] AT [facility] [address] [place], [place]  [address] Phone - [phone]   Fax - [phone] [facility] [place] [address] [place], [place]  [address] Phone - [phone]   Fax - [phone]  [facility] [address] [place], [place]  [address] Phone - [phone]   Fax - [phone]  [facility] [address], [address] [place], [place]  [address] Phone - [phone]   Fax - [phone]  [facility] [place] [address] [place], [place]  [address] Phone - [phone]   Fax - [phone]  [facility] [address], [address] [place], [place]  [address] Phone - [phone]  [facility] [address] [place], [place]  [address] Phone - [phone]   Fax - [phone]  [facility] [address] [place], [place]  [address] Phone - [phone]   Fax - [phone]  [facility] [address] [place], [place]  [address] Phone - [phone]   Fax - [phone]  [facility] [address] [place], [place]  [address] Phone - [phone]   Fax - [phone]   [facility] [address] [place], [place]  [address] Phone - [phone]   Fax - [phone]  [facility] [address] [place], [place]  [address] Phone - [phone]   Fax - [phone]  Sarah Hester [address] [place], [place]  [address] Phone - [phone]   Fax - [phone]  [facility] [address] [place], [place]  [address] Phone - [phone]   Fax - [phone]  [facility] [address] [place], [place]  [address] Phone - [phone]   Fax - [phone] Sarah Hester [address] [place], [place]  [address] Phone - [phone]   Fax - [phone]  [facility] [address] [place], [place]  [address] Phone - [phone]   Fax - [phone]  [facility] - [place] [address] [place], [place]  [address] Phone - [phone]   Fax - [phone]

## 2021-07-09 NOTE — Progress Notes (Signed)
INITIAL OBSTETRICAL VISIT Patient name: Sarah Hester MRN [id number]  Date of birth: 28-Dec-1995 Chief Complaint:   Initial Prenatal Visit (NOB Transfer)  History of Present Illness:   Sarah Hester is a 25 y.o. G39P1011 African American female at [redacted]w[redacted]d by LMP with an Estimated Date of Delivery: 07/29/21 being seen today for her initial obstetrical visit. She is transferring her OB care, because she plans to deliver at [facility]. 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 This is an unplanned pregnancy. She and the father of the baby (FOB- different paternity than 1st pregnancy) "Tahlik" do not live together. She has a support system that consists of the FOB/family/friends. Today she reports no complaints.   Patient's last menstrual period was 10/22/2020. Last pap 2017. Results were: normal Review of Systems:   Pertinent items are noted in HPI Denies cramping/contractions, leakage of fluid, vaginal bleeding, abnormal vaginal discharge w/ itching/odor/irritation, headaches, visual changes, shortness of breath, chest pain, abdominal pain, severe nausea/vomiting, or problems with urination or bowel movements unless otherwise stated above.  Pertinent History Reviewed:  Reviewed past medical,surgical, social, obstetrical and family history.  Reviewed problem list, medications and allergies. OB History  Gravida Para Term Preterm AB Living  3 1 1   1 1   SAB IAB Ectopic Multiple Live Births    1     1    # Outcome Date GA Lbr Len/2nd Weight Sex Delivery Anes PTL Lv  3 Current           2 Term 01/13/14 [redacted]w[redacted]d 02:15 / 00:16 6 lb 11.4 oz (3.045 kg) M Vag-Spont None  LIV  1 IAB 09/2012           Physical Assessment:   Vitals:   07/09/21 0819  BP: 117/73  Pulse: 94  Temp: 98 F (36.7 C)  Weight: 183 lb 3.2 oz (83.1 kg)  Body mass index is 29.57 kg/m.       Physical Examination:  General appearance - well appearing, and  in no distress  Mental status - alert, oriented to person, place, and time  Psych:  She has a normal mood and affect  Skin - warm and dry, normal color, no suspicious lesions noted  Chest - effort normal, all lung fields clear to auscultation bilaterally  Heart - normal rate and regular rhythm  Abdomen - soft, nontender  Extremities:  No swelling or varicosities noted  Pelvic - VULVA: normal appearing vulva with no masses, tenderness or lesions  VAGINA: normal appearing vagina with normal color and discharge, no lesions.   CERVIX: normal appearing cervix without discharge or lesions, no CMT  Thin prep pap is not done    FHTs by doppler: 140 bpm  Assessment & Plan:  1) Low-Risk Pregnancy G3P1011 at [redacted]w[redacted]d with an Estimated Date of Delivery: 07/29/21   2) Initial OB visit - Welcomed to practice and introduced self to patient in addition to discussing other advanced practice providers that she may be seeing at this practice - Congratulated patient - Anticipatory guidance on upcoming appointments - Educated on COVID19 and pregnancy and the integration of virtual appointments  - Educated on babyscripts app- patient reports she has not received email, encouraged to look in spam folder and to call office if she still has not received email - patient verbalizes understanding    3) Supervision of other normal pregnancy, antepartum - Pap to be done at [facility] visit  - Information  provided on pediatrician list   4) Trichomonal vaginitis during pregnancy in third trimester - Explained that cramping is likely from infection, but could also be normal at this gestation - TOC at [facility] visit  5) [redacted] weeks gestation of pregnancy     Meds: No orders of the defined types were placed in this encounter.   Initial labs obtained Continue prenatal vitamins Reviewed n/v relief measures and warning s/s to report Reviewed recommended weight gain based on pre-gravid BMI Encouraged well-balanced diet Genetic  Screening discussed: results reviewed Cystic fibrosis, SMA, Fragile X screening discussed results reviewed The nature of [facility] with multiple MDs and other Advanced Practice Providers was explained to patient; also emphasized that residents, students are part of our team.  Discussed optimized OB schedule and video visits. Advised can have an in-office visit whenever she feels she needs to be seen.  Does not have own BP cuff.  Explained to patient that BP will be mailed to her house. Check BP weekly, let us know if >140/90. Advised to call during normal business hours and there is an after-hours nurse line available.    Follow-up: Return in about 1 week (around 07/16/2021) for Return OB visit.   No orders of the defined types were placed in this encounter.   Raelyn Mora MSN, CNM 07/09/2021

## 2021-07-11 ENCOUNTER — Other Ambulatory Visit: Payer: Self-pay

## 2021-07-11 ENCOUNTER — Inpatient Hospital Stay (HOSPITAL_BASED_OUTPATIENT_CLINIC_OR_DEPARTMENT_OTHER): Payer: Medicaid Other

## 2021-07-11 ENCOUNTER — Encounter (HOSPITAL_COMMUNITY): Payer: Self-pay | Admitting: Obstetrics and Gynecology

## 2021-07-11 ENCOUNTER — Observation Stay (HOSPITAL_COMMUNITY)
Admission: AD | Admit: 2021-07-11 | Discharge: 2021-07-12 | Disposition: A | Discharge status: Home / Self Care | Payer: Medicaid Other | Attending: Obstetrics and Gynecology | Admitting: Obstetrics and Gynecology

## 2021-07-11 DIAGNOSIS — O368390 Maternal care for abnormalities of the fetal heart rate or rhythm, unspecified trimester, not applicable or unspecified: Secondary | ICD-10-CM | POA: Diagnosis present

## 2021-07-11 DIAGNOSIS — O24419 Gestational diabetes mellitus in pregnancy, unspecified control: Secondary | ICD-10-CM | POA: Insufficient documentation

## 2021-07-11 DIAGNOSIS — O133 Gestational [pregnancy-induced] hypertension without significant proteinuria, third trimester: Secondary | ICD-10-CM | POA: Insufficient documentation

## 2021-07-11 DIAGNOSIS — R109 Unspecified abdominal pain: Secondary | ICD-10-CM

## 2021-07-11 DIAGNOSIS — Z3A37 37 weeks gestation of pregnancy: Secondary | ICD-10-CM | POA: Insufficient documentation

## 2021-07-11 DIAGNOSIS — O368330 Maternal care for abnormalities of the fetal heart rate or rhythm, third trimester, not applicable or unspecified: Secondary | ICD-10-CM

## 2021-07-11 DIAGNOSIS — Z20822 Contact with and (suspected) exposure to covid-19: Secondary | ICD-10-CM | POA: Diagnosis not present

## 2021-07-11 HISTORY — DX: Herpesviral infection, unspecified: B00.9

## 2021-07-11 LAB — CBC
HCT: 33.8 % — ABNORMAL LOW (ref 36.0–46.0)
Hemoglobin: 11.4 g/dL — ABNORMAL LOW (ref 12.0–15.0)
MCH: 31.1 pg (ref 26.0–34.0)
MCHC: 33.7 g/dL (ref 30.0–36.0)
MCV: 92.3 fL (ref 80.0–100.0)
Platelets: 255 10*3/uL (ref 150–400)
RBC: 3.66 MIL/uL — ABNORMAL LOW (ref 3.87–5.11)
RDW: 12.7 % (ref 11.5–15.5)
WBC: 11.3 10*3/uL — ABNORMAL HIGH (ref 4.0–10.5)
nRBC: 0 % (ref 0.0–0.2)

## 2021-07-11 MED ORDER — LACTATED RINGERS IV BOLUS
1000.0000 mL | Freq: Once | INTRAVENOUS | Status: AC
  Administered 2021-07-11: 1000 mL via INTRAVENOUS

## 2021-07-11 NOTE — MAU Provider Note (Signed)
Event Date/Time   First Provider Initiated Contact with Patient 07/11/21 2304       S: [name] is a 25 y.o. G3P1011 at [redacted]w[redacted]d  who presents to MAU today complaining contractions that she feels are "pressure" and constant pain since 2200 (1 hour ago). She denies vaginal bleeding. She endorses LOF. She reports normal fetal movement.  She reports a strong pain on her right side which she describes as the baby "balling up".   O: BP 126/70 (BP Location: Right Arm)   Pulse 84   Temp 98.5 F (36.9 C) (Oral)   Resp 17   Ht 5\' 6"  (1.676 m)   Wt 83.2 kg   LMP 10/22/2020   SpO2 100%   BMI 29.62 kg/m  GENERAL: Well-developed, well-nourished female in no acute distress.  HEAD: Normocephalic, atraumatic.  CHEST: Normal effort of breathing, regular heart rate ABDOMEN: Soft, nontender, gravid  Cervical exam:  Dilation: 1 Effacement (%): Thick Exam by:: [email or web address], CNM   Fetal Monitoring: Baseline: 130 Variability: Mod Accelerations: present Decelerations: absent Contractions: uterine irratability  While in MAU patient had two repetitive prolonged decelerations; she was respositioned, IV started with bolus and admission labs drawn. Bedside BPP ordered, which was 8/8. She was further monitored and had another prolonged decel at 0032. MD consulted and will plan for admission to ante.   A: SIUP at [redacted]w[redacted]d  False labor Concern for fetal well-being, low index of suspicion for placental abruption due to fact that abdomen is soft, non-tender and she has no vaginal bleeding, moreover tracing is reassuring. However, will continue to monitor closely. Low index of suspicion for appendicitis given patient has no fever or white count.  P: Admit to ante; cervix was checked twice in MAU (2315 and 0100 and still FT/long/middle position).  She has no bleeding, leaking of fluid; abdominal pain feels like pressure.  Patient reports vomiting; will order zofran for her; see H and P for full  history. D  [redacted]w[redacted]d, CNM 07/11/2021 11:06 PM

## 2021-07-11 NOTE — MAU Note (Addendum)
Sarah Hester is a 25 y.o. at [redacted]w[redacted]d here in MAU reporting: contractions that began 2200 tonight. Pt denies bleeding or bloody show. Endorses + fetal movement.  Onset of complaint:  Vitals:   07/11/21 2235  BP: 126/70  Pulse: 84  Resp: 17  Temp: 98.5 F (36.9 C)  SpO2: 100%     FHT  134bpm Lab orders placed from triage:  mau labor

## 2021-07-12 ENCOUNTER — Encounter (HOSPITAL_COMMUNITY): Payer: Self-pay | Admitting: Obstetrics and Gynecology

## 2021-07-12 DIAGNOSIS — O368390 Maternal care for abnormalities of the fetal heart rate or rhythm, unspecified trimester, not applicable or unspecified: Secondary | ICD-10-CM | POA: Diagnosis present

## 2021-07-12 LAB — TYPE AND SCREEN
ABO/RH(D): B POS
Antibody Screen: NEGATIVE

## 2021-07-12 LAB — RESP PANEL BY RT-PCR (FLU A&B, COVID) ARPGX2
Influenza A by PCR: NEGATIVE
Influenza B by PCR: NEGATIVE
SARS Coronavirus 2 by RT PCR: NEGATIVE

## 2021-07-12 LAB — RPR: RPR Ser Ql: NONREACTIVE

## 2021-07-12 MED ORDER — ACETAMINOPHEN 325 MG PO TABS: 650.0000 mg | ORAL_TABLET | ORAL | Status: DC | PRN

## 2021-07-12 MED ORDER — CALCIUM CARBONATE ANTACID 500 MG PO CHEW: 2.0000 | CHEWABLE_TABLET | ORAL | Status: DC | PRN

## 2021-07-12 MED ORDER — PRENATAL MULTIVITAMIN CH
1.0000 | ORAL_TABLET | Freq: Every day | ORAL | Status: DC
  Administered 2021-07-12: 1 via ORAL
  Filled 2021-07-12: qty 1

## 2021-07-12 MED ORDER — LACTATED RINGERS IV BOLUS: 1000.0000 mL | Freq: Once | INTRAVENOUS | Status: DC

## 2021-07-12 MED ORDER — DOCUSATE SODIUM 100 MG PO CAPS
100.0000 mg | ORAL_CAPSULE | Freq: Every day | ORAL | Status: DC
  Filled 2021-07-12: qty 1

## 2021-07-12 MED ORDER — ZOLPIDEM TARTRATE 5 MG PO TABS: 5.0000 mg | ORAL_TABLET | Freq: Every evening | ORAL | Status: DC | PRN

## 2021-07-12 MED ORDER — ONDANSETRON 4 MG PO TBDP
8.0000 mg | ORAL_TABLET | Freq: Once | ORAL | Status: AC
  Administered 2021-07-12: 8 mg via ORAL
  Filled 2021-07-12: qty 2

## 2021-07-12 MED ORDER — VALACYCLOVIR HCL 500 MG PO TABS
1000.0000 mg | ORAL_TABLET | Freq: Two times a day (BID) | ORAL | Status: DC
  Administered 2021-07-12 (×2): 1000 mg via ORAL
  Filled 2021-07-12 (×3): qty 2

## 2021-07-12 MED ORDER — VALACYCLOVIR HCL 500 MG PO TABS: 500.0000 mg | ORAL_TABLET | Freq: Two times a day (BID) | ORAL | Status: DC

## 2021-07-12 MED ORDER — VALACYCLOVIR HCL 500 MG PO TABS: 500.0000 mg | ORAL_TABLET | Freq: Three times a day (TID) | ORAL | Status: DC

## 2021-07-12 NOTE — Progress Notes (Signed)
FACULTY PRACTICE ANTEPARTUM PROGRESS NOTE  Sarah Hester is a 25 y.o. G3P1011 at [redacted]w[redacted]d who is admitted for extended monitoring for variable decelerations.  Estimated Date of Delivery: 07/29/21 Fetal presentation is cephalic.  Length of Stay:  0 Days. Admitted 07/11/2021  Subjective: Pt seen doing well, no acute complaints Patient reports normal fetal movement.  She notes uterine contractions q 15 minutes in irregular fashion, denies bleeding and leaking of fluid per vagina.  Vitals:  Blood pressure (!) 111/57, pulse 69, temperature 98.2 F (36.8 C), temperature source Oral, resp. rate 17, height 5\' 6"  (1.676 m), weight 83.2 kg, last menstrual period 10/22/2020, SpO2 100 %, unknown if currently breastfeeding. Physical Examination: CONSTITUTIONAL: Well-developed, well-nourished female in no acute distress.  HENT:  Normocephalic, atraumatic, External right and left ear normal. Oropharynx is clear and moist EYES: Conjunctivae and EOM are normal.  NECK: Normal range of motion, supple, no masses. SKIN: Skin is warm and dry. No rash noted. Not diaphoretic. No erythema. No pallor. NEUROLGIC: Alert and oriented to person, place, and time. Normal reflexes, muscle tone coordination. No cranial nerve deficit noted. PSYCHIATRIC: Normal mood and affect. Normal behavior. Normal judgment and thought content. CARDIOVASCULAR: Normal heart rate noted, regular rhythm RESPIRATORY: Effort and breath sounds normal, no problems with respiration noted MUSCULOSKELETAL: Normal range of motion. No edema and no tenderness. ABDOMEN: Soft, nontender, nondistended, gravid. CERVIX: deferred  Fetal monitoring: FHR: 130s bpm, Variability: marked, Accelerations: Present, Decelerations: none currently Uterine activity: irritability with irregular contractions  Results for orders placed or performed during the hospital encounter of 07/11/21 (from the past 48 hour(s))  Type and screen [facility]     Status:  None   Collection Time: 07/11/21 11:18 PM  Result Value Ref Range   ABO/RH(D) B POS    Antibody Screen NEG    Sample Expiration      07/14/2021,2359 Performed at [facility], [address], [place], [place]   CBC     Status: Abnormal   Collection Time: 07/11/21 11:18 PM  Result Value Ref Range   WBC 11.3 (H) 4.0 - 10.5 K/uL   RBC 3.66 (L) 3.87 - 5.11 MIL/uL   Hemoglobin 11.4 (L) 12.0 - 15.0 g/dL   HCT 13/05/22 (L) 18.8 - 41.6 %   MCV 92.3 80.0 - 100.0 fL   MCH 31.1 26.0 - 34.0 pg   MCHC 33.7 30.0 - 36.0 g/dL   RDW 60.6 30.1 - 60.1 %   Platelets 255 150 - 400 K/uL   nRBC 0.0 0.0 - 0.2 %    Comment: Performed at [facility], [address], [place], [place]  Resp Panel by RT-PCR (Flu A&B, Covid) Nasopharyngeal Swab     Status: None   Collection Time: 07/12/21  1:03 AM   Specimen: Nasopharyngeal Swab; Nasopharyngeal(NP) swabs in vial transport medium  Result Value Ref Range   SARS Coronavirus 2 by RT PCR NEGATIVE NEGATIVE    Comment: (NOTE) SARS-CoV-2 target nucleic acids are NOT DETECTED.  The SARS-CoV-2 RNA is generally detectable in upper respiratory specimens during the acute phase of infection. The lowest concentration of SARS-CoV-2 viral copies this assay can detect is 138 copies/mL. A negative result does not preclude SARS-Cov-2 infection and should not be used as the sole basis for treatment or other patient management decisions. A negative result may occur with  improper specimen collection/handling, submission of specimen other than nasopharyngeal swab, presence of viral mutation(s) within the areas targeted  by this assay, and inadequate number of viral copies(<138 copies/mL). A negative result must be combined with clinical observations, patient history, and epidemiological information. The expected result is Negative.  Fact Sheet for Patients:  BloggerCourse.com  Fact Sheet for Healthcare Providers:   SeriousBroker.it  This test is no t yet approved or cleared by the [place] FDA and  has been authorized for detection and/or diagnosis of SARS-CoV-2 by FDA under an Emergency Use Authorization (EUA). This EUA will remain  in effect (meaning this test can be used) for the duration of the COVID-19 declaration under Section 564(b)(1) of the Act, 21 U.S.C.section 360bbb-3(b)(1), unless the authorization is terminated  or revoked sooner.       Influenza A by PCR NEGATIVE NEGATIVE   Influenza B by PCR NEGATIVE NEGATIVE    Comment: (NOTE) The Xpert Xpress SARS-CoV-2/FLU/RSV plus assay is intended as an aid in the diagnosis of influenza from Nasopharyngeal swab specimens and should not be used as a sole basis for treatment. Nasal washings and aspirates are unacceptable for Xpert Xpress SARS-CoV-2/FLU/RSV testing.  Fact Sheet for Patients: BloggerCourse.com  Fact Sheet for Healthcare Providers: SeriousBroker.it  This test is not yet approved or cleared by the [place] FDA and has been authorized for detection and/or diagnosis of SARS-CoV-2 by FDA under an Emergency Use Authorization (EUA). This EUA will remain in effect (meaning this test can be used) for the duration of the COVID-19 declaration under Section 564(b)(1) of the Act, 21 U.S.C. section 360bbb-3(b)(1), unless the authorization is terminated or revoked.  Performed at [facility], [address] [place], [place] [address]     I have reviewed the patient's current medications.  ASSESSMENT: Active Problems:   Antepartum variable deceleration   PLAN: Anticipate discharge home with fetal kick counts and NST this week   Continue routine antenatal care.   Mariel Aloe, MD [facility] Faculty Attending, Center for [facility] 07/12/2021 8:29 AM

## 2021-07-12 NOTE — H&P (Signed)
FACULTY PRACTICE ANTEPARTUM ADMISSION HISTORY AND PHYSICAL NOTE   History of Present Illness: Sarah Hester is a 25 y.o. G3P1011 at [redacted]w[redacted]d admitted for Cat 2 tracing while in MAU for labor check. She denies any HA, SOB, fever, chills, chest pain. She denies any complications in this pregnancy-no h/o of GDM, HTN, preterm deliveries. She received care at [facility] but transferred to [facility] and has had one prenatal visit with [facility]. 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 She also takes Valtrex 500 TID.  Patient reports the fetal movement as active Patient reports uterine contraction  activity as irregular, every 2-3 minutes. Patient reports  vaginal bleeding as none. Patient describes fluid per vagina as None. Fetal presentation is cephalic.  Patient Active Problem List   Diagnosis Date Noted   Antepartum variable deceleration 07/12/2021   Supervision of other normal pregnancy, antepartum 07/09/2021   Trichomonal vaginitis during pregnancy 07/07/2021   Pyuria 06/14/2013   Abdominal pain 06/14/2013    Past Medical History:  Diagnosis Date   Medical history non-contributory     Past Surgical History:  Procedure Laterality Date   NO PAST SURGERIES      OB History  Gravida Para Term Preterm AB Living  3 1 1   1 1   SAB IAB Ectopic Multiple Live Births    1     1    # Outcome Date GA Lbr Len/2nd Weight Sex Delivery Anes PTL Lv  3 Current           2 Term 01/13/14 [redacted]w[redacted]d 02:15 / 00:16 3045 g M Vag-Spont None  LIV  1 IAB 09/2012            Social History   Socioeconomic History   Marital status: Single    Spouse name: Not on file   Number of children: 1   Years of education: Not on file   Highest education level: High school graduate  Occupational History   Not on file  Tobacco Use   Smoking status: Never    Passive exposure: Never   Smokeless tobacco: Never  Vaping Use   Vaping Use: Never used  Substance  and Sexual Activity   Alcohol use: No   Drug use: No   Sexual activity: Yes    Birth control/protection: None    Comment: pregnant  Other Topics Concern   Not on file  Social History Narrative   Not on file   Social Determinants of Health   Financial Resource Strain: Not on file  Food Insecurity: Not on file  Transportation Needs: Not on file  Physical Activity: Not on file  Stress: Not on file  Social Connections: Not on file    Family History  Problem Relation Age of Onset   Diabetes Maternal Grandmother    Heart disease Neg Hx    Hypertension Neg Hx    Stroke Neg Hx    Mental illness Neg Hx    Drug abuse Neg Hx    Depression Neg Hx     No Known Allergies  Medications Prior to Admission  Medication Sig Dispense Refill Last Dose   cetirizine (ZYRTEC ALLERGY) 10 MG tablet Take 1 tablet (10 mg total) by mouth daily. 30 tablet 0 Past Month   Prenatal Vit-Fe Fumarate-FA (MULTIVITAMIN-PRENATAL) 27-0.8 MG TABS tablet Take by mouth daily at 12 noon.   07/11/2021   triamcinolone cream (KENALOG) 0.1 % Apply 1 application topically 2 (two) times daily. 30 g  0 Past Month   valACYclovir (VALTREX) 1000 MG tablet Take 1,000 mg by mouth 2 (two) times daily.   Past Month    Review of Systems - Negative except abdominal pain due to contractions  Vitals:  BP 126/70 (BP Location: Right Arm)   Pulse 84   Temp 98.5 F (36.9 C) (Oral)   Resp 17   Ht 5\' 6"  (1.676 m)   Wt 83.2 kg   LMP 10/22/2020   SpO2 100%   BMI 29.62 kg/m  Physical Examination: CONSTITUTIONAL: Well-developed, well-nourished female in no acute distress.  HENT:  Normocephalic, atraumatic, External right and left ear normal. Oropharynx is clear and moist EYES: Conjunctivae and EOM are normal. Pupils are equal, round, and reactive to light. No scleral icterus.  NECK: Normal range of motion, supple, no masses SKIN: Skin is warm and dry. No rash noted. Not diaphoretic. No erythema. No pallor. NEUROLOGIC: Alert and  oriented to person, place, and time. Normal reflexes, muscle tone coordination. No cranial nerve deficit noted. PSYCHIATRIC: Normal mood and affect. Normal behavior. Normal judgment and thought content. CARDIOVASCULAR: Normal heart rate noted, regular rhythm RESPIRATORY: Effort and breath sounds normal, no problems with respiration noted ABDOMEN: Soft, nontender, nondistended, gravid. MUSCULOSKELETAL: Normal range of motion. No edema and no tenderness. 2+ distal pulses.  Cervix: 1 cm/thick/middle positiona and fetal presentation is cephalic. Membranes:intact Fetal Monitoring:Baseline: 135  bpm mod var, present acel, no decels, uterine irratability Tocometer: Flat  Labs:  Results for orders placed or performed during the hospital encounter of 07/11/21 (from the past 24 hour(s))  CBC   Collection Time: 07/11/21 11:18 PM  Result Value Ref Range   WBC 11.3 (H) 4.0 - 10.5 K/uL   RBC 3.66 (L) 3.87 - 5.11 MIL/uL   Hemoglobin 11.4 (L) 12.0 - 15.0 g/dL   HCT 13/05/22 (L) 59.1 - 63.8 %   MCV 92.3 80.0 - 100.0 fL   MCH 31.1 26.0 - 34.0 pg   MCHC 33.7 30.0 - 36.0 g/dL   RDW 46.6 59.9 - 35.7 %   Platelets 255 150 - 400 K/uL   nRBC 0.0 0.0 - 0.2 %  Type and screen [facility]   Collection Time: 07/11/21 11:18 PM  Result Value Ref Range   ABO/RH(D) B POS    Antibody Screen NEG    Sample Expiration      07/14/2021,2359 Performed at [facility], [address] [place], [place]     Imaging Studies: No results found.   Assessment and Plan: Patient Active Problem List   Diagnosis Date Noted   Antepartum variable deceleration 07/12/2021   Supervision of other normal pregnancy, antepartum 07/09/2021   Trichomonal vaginitis during pregnancy 07/07/2021   Pyuria 06/14/2013   Abdominal pain 06/14/2013    Discussed with Dr. 08/14/2013; patient had 8/8 BPP but three large decelerations while on monitor.  Admit to Antenatal Routine antenatal care Dr. Donavan Foil to put in  admission orders Orders placed for Valtrex and Zofran  Donavan Foil CNM

## 2021-07-12 NOTE — Discharge Summary (Signed)
Antenatal Physician Discharge Summary  Patient ID: Sarah Hester MRN: [id number] DOB/AGE: 09-Sep-1995 25 y.o.  Admit date: 07/11/2021 Discharge date: 07/12/2021  Admission Diagnoses:  Discharge Diagnoses:   Prenatal Procedures: ultrasound  Consults: Neonatology, Maternal Fetal Medicine  Hospital Course:  Sarah Hester is a 25 y.o. G3P1011 with IUP at [redacted]w[redacted]d admitted for variable decels x 3.  She was admitted with irregular contractions, noted to have a cervical exam of 1/thk.  During eval pt had 3 spontaneous decelerations lasting 2-3 minutes.  Pt was kept for extended monitoring. Fetal tracing was reactive and she was deemed stable for discharge to home with outpatient follow up.  Discharge Exam: Temp:  [97.6 F (36.4 C)-98.5 F (36.9 C)] 98.2 F (36.8 C) (11/06 0806) Pulse Rate:  [69-92] 69 (11/06 0806) Resp:  [16-20] 17 (11/06 0806) BP: (110-126)/(57-70) 111/57 (11/06 0806) SpO2:  [100 %] 100 % (11/06 0806) Weight:  [83.2 kg] 83.2 kg (11/05 2235) Physical Examination: CONSTITUTIONAL: Well-developed, well-nourished female in no acute distress.  HENT:  Normocephalic, atraumatic, External right and left ear normal. Oropharynx is clear and moist EYES: Conjunctivae and EOM are normal.   NECK: Normal range of motion, supple, no masses SKIN: Skin is warm and dry. No rash noted. Not diaphoretic. No erythema. No pallor. NEUROLGIC: Alert and oriented to person, place, and time. Normal reflexes, muscle tone coordination. No cranial nerve deficit noted. PSYCHIATRIC: Normal mood and affect. Normal behavior. Normal judgment and thought content. CARDIOVASCULAR: Normal heart rate noted, regular rhythm RESPIRATORY: Effort and breath sounds normal, no problems with respiration noted MUSCULOSKELETAL: Normal range of motion. No edema and no tenderness. 2+ distal pulses. ABDOMEN: Soft, nontender, nondistended, gravid. CERVIX: Dilation: 1 Effacement (%): Thick Exam by:: Luna Kitchens,  CNM  Significant Diagnostic Studies:  Results for orders placed or performed during the hospital encounter of 07/11/21 (from the past 168 hour(s))  CBC   Collection Time: 07/11/21 11:18 PM  Result Value Ref Range   WBC 11.3 (H) 4.0 - 10.5 K/uL   RBC 3.66 (L) 3.87 - 5.11 MIL/uL   Hemoglobin 11.4 (L) 12.0 - 15.0 g/dL   HCT 79.3 (L) 90.3 - 00.9 %   MCV 92.3 80.0 - 100.0 fL   MCH 31.1 26.0 - 34.0 pg   MCHC 33.7 30.0 - 36.0 g/dL   RDW 23.3 00.7 - 62.2 %   Platelets 255 150 - 400 K/uL   nRBC 0.0 0.0 - 0.2 %  Type and screen [facility]   Collection Time: 07/11/21 11:18 PM  Result Value Ref Range   ABO/RH(D) B POS    Antibody Screen NEG    Sample Expiration      07/14/2021,2359 Performed at [facility], [address], [place], [place] [address]   Resp Panel by RT-PCR (Flu A&B, Covid) Nasopharyngeal Swab   Collection Time: 07/12/21  1:03 AM   Specimen: Nasopharyngeal Swab; Nasopharyngeal(NP) swabs in vial transport medium  Result Value Ref Range   SARS Coronavirus 2 by RT PCR NEGATIVE NEGATIVE   Influenza A by PCR NEGATIVE NEGATIVE   Influenza B by PCR NEGATIVE NEGATIVE  Results for orders placed or performed during the hospital encounter of 07/07/21 (from the past 168 hour(s))  Urinalysis, Routine w reflex microscopic Urine, Clean Catch   Collection Time: 07/07/21  8:16 AM  Result Value Ref Range   Color, Urine STRAW (A) YELLOW   APPearance CLEAR CLEAR   Specific Gravity, Urine 1.004 (L) 1.005 - 1.030   pH 6.0  5.0 - 8.0   Glucose, UA NEGATIVE NEGATIVE mg/dL   Hgb urine dipstick MODERATE (A) NEGATIVE   Bilirubin Urine NEGATIVE NEGATIVE   Ketones, ur NEGATIVE NEGATIVE mg/dL   Protein, ur NEGATIVE NEGATIVE mg/dL   Nitrite NEGATIVE NEGATIVE   Leukocytes,Ua TRACE (A) NEGATIVE   RBC / HPF 0-5 0 - 5 RBC/hpf   WBC, UA 0-5 0 - 5 WBC/hpf   Bacteria, UA NONE SEEN NONE SEEN   Squamous Epithelial / LPF 0-5 0 - 5  GC/Chlamydia probe amp (Person)not at [facility]    Collection Time: 07/07/21  8:32 AM  Result Value Ref Range   Neisseria Gonorrhea Negative    Chlamydia Negative    Comment Normal Reference Ranger Chlamydia - Negative    Comment      Normal Reference Range Neisseria Gonorrhea - Negative  Wet prep, genital   Collection Time: 07/07/21  8:36 AM  Result Value Ref Range   Yeast Wet Prep HPF POC NONE SEEN NONE SEEN   Trich, Wet Prep PRESENT (A) NONE SEEN   Clue Cells Wet Prep HPF POC NONE SEEN NONE SEEN   WBC, Wet Prep HPF POC MANY (A) NONE SEEN   Sperm NONE SEEN    No results found.  Future Appointments  Date Time Provider Department Center  07/16/2021 10:55 AM Raelyn Mora, CNM [facility] None  07/23/2021 11:15 AM Raelyn Mora, CNM [facility] None  07/28/2021  1:00 PM Marny Lowenstein, PA-C [facility] None    Discharge Condition: Stable  Discharge disposition: 01-Home or Self Care       Discharge Instructions     Discharge activity:  No Restrictions   Complete by: As directed    Discharge diet:  No restrictions   Complete by: As directed    Fetal Kick Count:  Lie on our left side for one hour after a meal, and count the number of times your baby kicks.  If it is less than 5 times, get up, move around and drink some juice.  Repeat the test 30 minutes later.  If it is still less than 5 kicks in an hour, notify your doctor.   Complete by: As directed    LABOR:  When conractions begin, you should start to time them from the beginning of one contraction to the beginning  of the next.  When contractions are 5 - 10 minutes apart or less and have been regular for at least an hour, you should call your health care provider.   Complete by: As directed    Notify physician for bleeding from the vagina   Complete by: As directed    Notify physician for blurring of vision or spots before the eyes   Complete by: As directed    Notify physician for chills or fever   Complete by: As directed    Notify physician for fainting spells,  "black outs" or loss of consciousness   Complete by: As directed    Notify physician for increase in vaginal discharge   Complete by: As directed    Notify physician for leaking of fluid   Complete by: As directed    Notify physician for pain or burning when urinating   Complete by: As directed    Notify physician for pelvic pressure (sudden increase)   Complete by: As directed    Notify physician for severe or continued nausea or vomiting   Complete by: As directed    Notify physician for sudden gushing of fluid from the  vagina (with or without continued leaking)   Complete by: As directed    Notify physician for sudden, constant, or occasional abdominal pain   Complete by: As directed    Notify physician if baby moving less than usual   Complete by: As directed       Allergies as of 07/12/2021   No Known Allergies      Medication List     TAKE these medications    cetirizine 10 MG tablet Commonly known as: ZyrTEC Allergy Take 1 tablet (10 mg total) by mouth daily.   multivitamin-prenatal 27-0.8 MG Tabs tablet Take by mouth daily at 12 noon.   triamcinolone cream 0.1 % Commonly known as: KENALOG Apply 1 application topically 2 (two) times daily.   valACYclovir 1000 MG tablet Commonly known as: VALTREX Take 1,000 mg by mouth 2 (two) times daily.        Follow-up Information     [facility]. Schedule an appointment as soon as possible for a visit on 07/14/2021.   Specialty: Obstetrics and Gynecology Why: NST Contact information: [address] [place] [address] [phone]                Total discharge time: 20 minutes   Signed: Warden Fillers M.D. 07/12/2021, 8:49 AM

## 2021-07-14 ENCOUNTER — Other Ambulatory Visit: Payer: Self-pay

## 2021-07-14 ENCOUNTER — Ambulatory Visit: Payer: Medicaid Other | Admitting: *Deleted

## 2021-07-14 VITALS — BP 119/67 | HR 108

## 2021-07-14 DIAGNOSIS — O368330 Maternal care for abnormalities of the fetal heart rate or rhythm, third trimester, not applicable or unspecified: Secondary | ICD-10-CM

## 2021-07-14 NOTE — Progress Notes (Signed)
Pt presents for follow up NST today due to fetal heart rate decels which occurred on 11/5 during MAU visit for r/o labor. She was admitted and then discharged from hospital on 11/6 following BPP of 8/8 and no further decels throughout the night.  Pt reports good FM and that she has continued to have irregular UC's since discharge. NST reactive w/occasional mild variable - none prolonged such as occurred @ [facility].

## 2021-07-16 ENCOUNTER — Other Ambulatory Visit: Payer: Self-pay

## 2021-07-16 ENCOUNTER — Ambulatory Visit (INDEPENDENT_AMBULATORY_CARE_PROVIDER_SITE_OTHER): Payer: Medicaid Other | Admitting: Obstetrics and Gynecology

## 2021-07-16 ENCOUNTER — Encounter: Payer: Self-pay | Admitting: Obstetrics and Gynecology

## 2021-07-16 VITALS — BP 114/73 | HR 65 | Temp 98.2°F | Wt 187.0 lb

## 2021-07-16 DIAGNOSIS — Z3A38 38 weeks gestation of pregnancy: Secondary | ICD-10-CM

## 2021-07-16 DIAGNOSIS — B009 Herpesviral infection, unspecified: Secondary | ICD-10-CM

## 2021-07-16 DIAGNOSIS — Z3480 Encounter for supervision of other normal pregnancy, unspecified trimester: Secondary | ICD-10-CM

## 2021-07-16 DIAGNOSIS — O98513 Other viral diseases complicating pregnancy, third trimester: Secondary | ICD-10-CM | POA: Diagnosis not present

## 2021-07-16 NOTE — Progress Notes (Signed)
   LOW-RISK PREGNANCY OFFICE VISIT Patient name: Sarah Hester MRN [id number]  Date of birth: 09/28/1995 Chief Complaint:   Routine Prenatal Visit  History of Present Illness:   Sarah Hester is a 25 y.o. G79P1011 female at [redacted]w[redacted]d with an Estimated Date of Delivery: 07/29/21 being seen today for ongoing management of a low-risk pregnancy.  Today she reports occasional contractions and increased pelvic pressure . Contractions: Irregular. Vag. Bleeding: None.  Movement: Present. denies leaking of fluid. Review of Systems:   Pertinent items are noted in HPI Denies abnormal vaginal discharge w/ itching/odor/irritation, headaches, visual changes, shortness of breath, chest pain, abdominal pain, severe nausea/vomiting, or problems with urination or bowel movements unless otherwise stated above. Pertinent History Reviewed:  Reviewed past medical,surgical, social, obstetrical and family history.  Reviewed problem list, medications and allergies. Physical Assessment:   Vitals:   07/16/21 1104  BP: 114/73  Pulse: 65  Temp: 98.2 F (36.8 C)  Weight: 187 lb (84.8 kg)  Body mass index is 30.18 kg/m.        Physical Examination:   General appearance: Well appearing, and in no distress  Mental status: Alert, oriented to person, place, and time  Skin: Warm & dry  Cardiovascular: Normal heart rate noted  Respiratory: Normal respiratory effort, no distress  Abdomen: Soft, gravid, nontender  Pelvic: Cervical exam performed  Dilation: 1 Effacement (%): 50 Station: Ballotable, -3  Extremities: Edema: None  Fetal Status: Fetal Heart Rate (bpm): 146 Fundal Height: 38 cm Movement: Present Presentation: Vertex  No results found for this or any previous visit (from the past 24 hour(s)).  Assessment & Plan:  1) Low-risk pregnancy G3P1011 at [redacted]w[redacted]d with an Estimated Date of Delivery: 07/29/21   2) Supervision of other normal pregnancy, antepartum - Await labor  3) HSV-2 infection complicating  pregnancy, third trimester - Taking Valtrex as prescribed  4) [redacted] weeks gestation of pregnancy   Meds: No orders of the defined types were placed in this encounter.  Labs/procedures today: cervical exam  Plan:  Continue routine obstetrical care   Reviewed: Term labor symptoms and general obstetric precautions including but not limited to vaginal bleeding, contractions, leaking of fluid and fetal movement were reviewed in detail with the patient.  All questions were answered. Has home bp cuff. Check bp weekly, let us know if >140/90.   Follow-up: Return in about 1 week (around 07/23/2021) for Return OB visit.  No orders of the defined types were placed in this encounter.  Raelyn Mora MSN, CNM 07/16/2021 12:21 PM

## 2021-07-22 ENCOUNTER — Inpatient Hospital Stay (HOSPITAL_COMMUNITY)
Admission: AD | Admit: 2021-07-22 | Discharge: 2021-07-22 | Disposition: A | Discharge status: Home / Self Care | Payer: Medicaid Other | Source: Home / Self Care | Attending: Obstetrics & Gynecology | Admitting: Obstetrics & Gynecology

## 2021-07-22 ENCOUNTER — Inpatient Hospital Stay (HOSPITAL_COMMUNITY)
Admission: AD | Admit: 2021-07-22 | Discharge: 2021-07-24 | DRG: 806 | Disposition: A | Discharge status: Home / Self Care | Payer: Medicaid Other | Attending: Obstetrics & Gynecology | Admitting: Obstetrics & Gynecology

## 2021-07-22 ENCOUNTER — Encounter (HOSPITAL_COMMUNITY): Payer: Self-pay | Admitting: Obstetrics & Gynecology

## 2021-07-22 ENCOUNTER — Other Ambulatory Visit: Payer: Self-pay

## 2021-07-22 DIAGNOSIS — Z20822 Contact with and (suspected) exposure to covid-19: Secondary | ICD-10-CM | POA: Diagnosis present

## 2021-07-22 DIAGNOSIS — O9852 Other viral diseases complicating childbirth: Secondary | ICD-10-CM | POA: Diagnosis not present

## 2021-07-22 DIAGNOSIS — O9832 Other infections with a predominantly sexual mode of transmission complicating childbirth: Secondary | ICD-10-CM | POA: Diagnosis present

## 2021-07-22 DIAGNOSIS — B009 Herpesviral infection, unspecified: Secondary | ICD-10-CM | POA: Diagnosis not present

## 2021-07-22 DIAGNOSIS — Z3A39 39 weeks gestation of pregnancy: Secondary | ICD-10-CM | POA: Insufficient documentation

## 2021-07-22 DIAGNOSIS — O4202 Full-term premature rupture of membranes, onset of labor within 24 hours of rupture: Secondary | ICD-10-CM | POA: Diagnosis not present

## 2021-07-22 DIAGNOSIS — O479 False labor, unspecified: Secondary | ICD-10-CM

## 2021-07-22 DIAGNOSIS — A6000 Herpesviral infection of urogenital system, unspecified: Secondary | ICD-10-CM | POA: Diagnosis present

## 2021-07-22 DIAGNOSIS — O23599 Infection of other part of genital tract in pregnancy, unspecified trimester: Secondary | ICD-10-CM | POA: Diagnosis present

## 2021-07-22 DIAGNOSIS — O26893 Other specified pregnancy related conditions, third trimester: Secondary | ICD-10-CM | POA: Diagnosis present

## 2021-07-22 DIAGNOSIS — A5901 Trichomonal vulvovaginitis: Secondary | ICD-10-CM | POA: Diagnosis present

## 2021-07-22 DIAGNOSIS — O471 False labor at or after 37 completed weeks of gestation: Secondary | ICD-10-CM | POA: Insufficient documentation

## 2021-07-22 LAB — CBC
HCT: 33.3 % — ABNORMAL LOW (ref 36.0–46.0)
Hemoglobin: 11.2 g/dL — ABNORMAL LOW (ref 12.0–15.0)
MCH: 31 pg (ref 26.0–34.0)
MCHC: 33.6 g/dL (ref 30.0–36.0)
MCV: 92.2 fL (ref 80.0–100.0)
Platelets: 285 10*3/uL (ref 150–400)
RBC: 3.61 MIL/uL — ABNORMAL LOW (ref 3.87–5.11)
RDW: 13 % (ref 11.5–15.5)
WBC: 12.2 10*3/uL — ABNORMAL HIGH (ref 4.0–10.5)
nRBC: 0 % (ref 0.0–0.2)

## 2021-07-22 LAB — TYPE AND SCREEN
ABO/RH(D): B POS
Antibody Screen: NEGATIVE

## 2021-07-22 MED ORDER — LACTATED RINGERS IV SOLN: INTRAVENOUS | Status: DC

## 2021-07-22 NOTE — MAU Note (Signed)
Late entry for 1902 07/22/2021. Patient presents to MAU c/o ctx that are 5 mins apart. Pain during ctx is at a 7. Denies LOF or VB. Endorses + FM.

## 2021-07-22 NOTE — MAU Provider Note (Signed)
Reviewed FHR tracing with RN. FHR baseline 135 with moderate variability, positive accelerations, variable deceleration x 1 followed by Category I FHR tracing with accelerations after position change.    Will continue to monitor FHR and RN to recheck cervix in 1 hour and call labor provider.

## 2021-07-22 NOTE — MAU Provider Note (Signed)
MAU Provider Note  Patient was recently discharged from MAU, but returned reporting SROM of clear fluid at 2145. Had contractions q 3 minutes. No VB. Good FM.  AFVSS Reactive NST Dilation: 3 Effacement (%): 70 Station: -2 Presentation: Vertex Exam by:: Zenia Resides, RN Negative SSE for any lesions, on HSV suppression therapy.   Will admit to L&D. Please refer to H & P for admission details.   Jaynie Collins, MD, FACOG Attending Obstetrician & Gynecologist Faculty Practice, [facility]

## 2021-07-22 NOTE — MAU Provider Note (Signed)
Labor Check    S: Ms. Aquilla Shambley is a 25 y.o. G3P1011 at [redacted]w[redacted]d  who presents to MAU today complaining contractions q 10 minutes since earlier today. She denies vaginal bleeding. She denies LOF. She reports normal fetal movement.    O: BP 118/77   Pulse (!) 102   LMP 10/22/2020   SpO2 95%   Cervical exam:  Dilation: 3 Effacement (%): 50 Cervical Position: Posterior Station: -3 Presentation: Vertex Exam by:: [employer], RN Unchanged >1.5 hours   Fetal Monitoring: Baseline: 130 Variability: moderate Accelerations: 15x15 Decelerations: None Contractions: every 10    A: SIUP at [redacted]w[redacted]d  False labor Reactive NST   P: Discharge home.  Given strict MAU return precautions.   Allayne Stack, DO 07/22/2021 8:57 PM

## 2021-07-23 ENCOUNTER — Encounter (HOSPITAL_COMMUNITY): Payer: Self-pay | Admitting: Obstetrics & Gynecology

## 2021-07-23 ENCOUNTER — Encounter: Payer: Medicaid Other | Admitting: Obstetrics and Gynecology

## 2021-07-23 DIAGNOSIS — Z3A39 39 weeks gestation of pregnancy: Secondary | ICD-10-CM | POA: Diagnosis not present

## 2021-07-23 DIAGNOSIS — B009 Herpesviral infection, unspecified: Secondary | ICD-10-CM | POA: Diagnosis not present

## 2021-07-23 DIAGNOSIS — O4202 Full-term premature rupture of membranes, onset of labor within 24 hours of rupture: Secondary | ICD-10-CM | POA: Diagnosis not present

## 2021-07-23 DIAGNOSIS — O9852 Other viral diseases complicating childbirth: Secondary | ICD-10-CM | POA: Diagnosis not present

## 2021-07-23 LAB — RPR: RPR Ser Ql: NONREACTIVE

## 2021-07-23 LAB — RESP PANEL BY RT-PCR (FLU A&B, COVID) ARPGX2
Influenza A by PCR: NEGATIVE
Influenza B by PCR: NEGATIVE
SARS Coronavirus 2 by RT PCR: NEGATIVE

## 2021-07-23 LAB — POCT FERN TEST: POCT Fern Test: POSITIVE

## 2021-07-23 MED ORDER — MEASLES, MUMPS & RUBELLA VAC IJ SOLR: 0.5000 mL | Freq: Once | INTRAMUSCULAR | Status: DC

## 2021-07-23 MED ORDER — IBUPROFEN 600 MG PO TABS
600.0000 mg | ORAL_TABLET | Freq: Four times a day (QID) | ORAL | Status: DC
  Administered 2021-07-23 – 2021-07-24 (×6): 600 mg via ORAL
  Filled 2021-07-23 (×6): qty 1

## 2021-07-23 MED ORDER — TETANUS-DIPHTH-ACELL PERTUSSIS 5-2.5-18.5 LF-MCG/0.5 IM SUSY: 0.5000 mL | PREFILLED_SYRINGE | Freq: Once | INTRAMUSCULAR | Status: DC

## 2021-07-23 MED ORDER — ZOLPIDEM TARTRATE 5 MG PO TABS: 5.0000 mg | ORAL_TABLET | Freq: Every evening | ORAL | Status: DC | PRN

## 2021-07-23 MED ORDER — SIMETHICONE 80 MG PO CHEW: 80.0000 mg | CHEWABLE_TABLET | ORAL | Status: DC | PRN

## 2021-07-23 MED ORDER — BENZOCAINE-MENTHOL 20-0.5 % EX AERO: 1.0000 "application " | INHALATION_SPRAY | CUTANEOUS | Status: DC | PRN

## 2021-07-23 MED ORDER — DIBUCAINE (PERIANAL) 1 % EX OINT: 1.0000 "application " | TOPICAL_OINTMENT | CUTANEOUS | Status: DC | PRN

## 2021-07-23 MED ORDER — ACETAMINOPHEN 325 MG PO TABS: 650.0000 mg | ORAL_TABLET | ORAL | Status: DC | PRN

## 2021-07-23 MED ORDER — PRENATAL MULTIVITAMIN CH
1.0000 | ORAL_TABLET | Freq: Every day | ORAL | Status: DC
  Administered 2021-07-23 – 2021-07-24 (×2): 1 via ORAL
  Filled 2021-07-23 (×2): qty 1

## 2021-07-23 MED ORDER — ONDANSETRON HCL 4 MG PO TABS: 4.0000 mg | ORAL_TABLET | ORAL | Status: DC | PRN

## 2021-07-23 MED ORDER — FENTANYL CITRATE (PF) 100 MCG/2ML IJ SOLN: 100.0000 ug | INTRAMUSCULAR | Status: DC | PRN

## 2021-07-23 MED ORDER — COCONUT OIL OIL: 1.0000 "application " | TOPICAL_OIL | Status: DC | PRN

## 2021-07-23 MED ORDER — OXYTOCIN-SODIUM CHLORIDE 30-0.9 UT/500ML-% IV SOLN
INTRAVENOUS | Status: AC
  Filled 2021-07-23: qty 500

## 2021-07-23 MED ORDER — ONDANSETRON HCL 4 MG/2ML IJ SOLN: 4.0000 mg | INTRAMUSCULAR | Status: DC | PRN

## 2021-07-23 MED ORDER — WITCH HAZEL-GLYCERIN EX PADS: 1.0000 "application " | MEDICATED_PAD | CUTANEOUS | Status: DC | PRN

## 2021-07-23 MED ORDER — VALACYCLOVIR HCL 500 MG PO TABS
1000.0000 mg | ORAL_TABLET | Freq: Two times a day (BID) | ORAL | Status: DC
  Administered 2021-07-23 – 2021-07-24 (×3): 1000 mg via ORAL
  Filled 2021-07-23 (×3): qty 2

## 2021-07-23 MED ORDER — OXYCODONE HCL 5 MG PO TABS: 5.0000 mg | ORAL_TABLET | ORAL | Status: DC | PRN

## 2021-07-23 MED ORDER — DIPHENHYDRAMINE HCL 25 MG PO CAPS: 25.0000 mg | ORAL_CAPSULE | Freq: Four times a day (QID) | ORAL | Status: DC | PRN

## 2021-07-23 MED ORDER — SENNOSIDES-DOCUSATE SODIUM 8.6-50 MG PO TABS
2.0000 | ORAL_TABLET | ORAL | Status: DC
  Administered 2021-07-23 – 2021-07-24 (×2): 2 via ORAL
  Filled 2021-07-23 (×2): qty 2

## 2021-07-23 MED ORDER — MEDROXYPROGESTERONE ACETATE 150 MG/ML IM SUSP: 150.0000 mg | INTRAMUSCULAR | Status: DC | PRN

## 2021-07-23 NOTE — Lactation Note (Signed)
This note was copied from a baby's chart. Lactation Consultation Note  Patient Name: Sarah Hester [id number] Date: 07/23/2021   Age:27 hours  Mom is a P2 who nursed her 1st child (now 30 yo) for 2 months until she was told that her breast milk had " too much sugar." Mom likely had oversupply (she could pump 5 oz from each breast).   Mom wants to have the option to give formula while here, but verbalized she does want to breastfeed. She has a "Freemie" pump at home.  Mom was made aware of O/P services, breastfeeding support groups, community resources, and our phone # for post-discharge questions.   [name] [facility] 07/23/2021, 8:51 AM

## 2021-07-23 NOTE — H&P (Addendum)
Sarah Hester is a 25 y.o. G38P1011 female at [redacted]w[redacted]d by LMP c/w 11wk scan presenting for reg ctx and SROM at 2145.  Denies recent HSV outbreaks; has been taking Valtrex. Reports active fetal movement, contractions: regular, every 4 minutes, vaginal bleeding: none, membranes: ruptured, clear.  Initiated prenatal care at [facility] at 12 wks (visits 08-22-19-24wks) with tx to [facility] at 37wks.  Most recent u/s: anatomy @ 20wk, appropriate growth, cx long, ant high placenta, nl fluid, anatomy complete (description only available).   This pregnancy complicated by: # trich dx 07/07/21 # hx HSV (on Valtrex)   Prenatal History/Complications:  # hx fast labor  Past Medical History: Past Medical History:  Diagnosis Date   HSV (herpes simplex virus) infection    Medical history non-contributory     Past Surgical History: Past Surgical History:  Procedure Laterality Date   NO PAST SURGERIES      Obstetrical History: OB History     Gravida  3   Para  1   Term  1   Preterm      AB  1   Living  1      SAB      IAB  1   Ectopic      Multiple      Live Births  1           Social History: Social History   Socioeconomic History   Marital status: Single    Spouse name: Not on file   Number of children: 1   Years of education: Not on file   Highest education level: High school graduate  Occupational History   Not on file  Tobacco Use   Smoking status: Never    Passive exposure: Never   Smokeless tobacco: Never  Vaping Use   Vaping Use: Never used  Substance and Sexual Activity   Alcohol use: No   Drug use: No   Sexual activity: Not Currently    Birth control/protection: None    Comment: pregnant  Other Topics Concern   Not on file  Social History Narrative   Not on file   Social Determinants of Health   Financial Resource Strain: Not on file  Food Insecurity: Not on file  Transportation Needs: Not on file  Physical Activity: Not on file  Stress: Not on  file  Social Connections: Not on file    Family History: Family History  Problem Relation Age of Onset   Diabetes Maternal Grandmother    Heart disease Neg Hx    Hypertension Neg Hx    Stroke Neg Hx    Mental illness Neg Hx    Drug abuse Neg Hx    Depression Neg Hx     Allergies: No Known Allergies  Medications Prior to Admission  Medication Sig Dispense Refill Last Dose   cetirizine (ZYRTEC ALLERGY) 10 MG tablet Take 1 tablet (10 mg total) by mouth daily. 30 tablet 0 Past Week   Prenatal Vit-Fe Fumarate-FA (MULTIVITAMIN-PRENATAL) 27-0.8 MG TABS tablet Take by mouth daily at 12 noon.   07/22/2021   valACYclovir (VALTREX) 1000 MG tablet Take 1,000 mg by mouth 2 (two) times daily.   07/22/2021   triamcinolone cream (KENALOG) 0.1 % Apply 1 application topically 2 (two) times daily. 30 g 0 Unknown    Review of Systems  Pertinent pos/neg as indicated in HPI  Last menstrual period 10/22/2020, unknown if currently breastfeeding. General appearance: alert and mild distress Lungs: clear to auscultation bilaterally Heart: regular  rate and rhythm Abdomen: gravid, soft, non-tender, EFW by Leopold's approximately 7lbs Extremities: trace edema DTR's nl  Fetal monitoring: FHR: 130s bpm, variability: moderate,  Accelerations: Present,  decelerations:  Absent Uterine activity: Frequency: Every 4 minutes Dilation: 4 Effacement (%): 80 Station: 0 Exam by:: Zenia Resides, RN Presentation: cephalic Pelvic: no s/s HSV lesions by Dr Macon Large   Prenatal labs: ABO, Rh: --/--/B POS (11/16 2308) Antibody: NEG (11/16 2308) Rubella:  immune (12/22/20) RPR: NON REACTIVE (11/05 2318)  HBsAg:   NR (12/22/20) HIV:   NR (05/06/21) GBS:   neg (07/01/21) 1hr glucola: 113 (05/06/21)  Prenatal Transfer Tool  Maternal Diabetes: No Genetic Screening: Normal Maternal Ultrasounds/Referrals: Normal Fetal Ultrasounds or other Referrals:  None Maternal Substance Abuse:  No Significant Maternal  Medications:  Meds include: Other: Valtrex Significant Maternal Lab Results: Group B Strep negative  Results for orders placed or performed during the hospital encounter of 07/22/21 (from the past 24 hour(s))  CBC   Collection Time: 07/22/21 11:07 PM  Result Value Ref Range   WBC 12.2 (H) 4.0 - 10.5 K/uL   RBC 3.61 (L) 3.87 - 5.11 MIL/uL   Hemoglobin 11.2 (L) 12.0 - 15.0 g/dL   HCT 16.3 (L) 84.6 - 65.9 %   MCV 92.2 80.0 - 100.0 fL   MCH 31.0 26.0 - 34.0 pg   MCHC 33.6 30.0 - 36.0 g/dL   RDW 93.5 70.1 - 77.9 %   Platelets 285 150 - 400 K/uL   nRBC 0.0 0.0 - 0.2 %  Type and screen [facility]   Collection Time: 07/22/21 11:08 PM  Result Value Ref Range   ABO/RH(D) B POS    Antibody Screen NEG    Sample Expiration      07/25/2021,2359 Performed at [facility], [address] [place], [place] [address]      Assessment:  [redacted]w[redacted]d SIUP  G3P1011  Early labor/SROM  Cat 1 FHR  GBS  neg  Plan:  Admit to L&D  IV pain meds/epidural prn active labor  Expectant management  Anticipate vag del  Plans to breastfeed   Contraception: Depo    Arabella Merles CNM 07/23/2021, 12:17 AM

## 2021-07-23 NOTE — Discharge Summary (Signed)
Postpartum Discharge Summary       Patient Name: Sarah Hester DOB: November 29, 1995 MRN: [id number]  Date of admission: 07/22/2021 Delivery date:07/23/2021  Delivering provider: Serita Grammes D  Date of discharge: 07/24/2021  Admitting diagnosis: Labor and delivery, indication for care [O75.9] Intrauterine pregnancy: [redacted]w[redacted]d    Secondary diagnosis:  Active Problems:   Trichomonal vaginitis during pregnancy   Herpes simplex   Labor and delivery, indication for care  Additional problems: none    Discharge diagnosis: Term Pregnancy Delivered                                              Post partum procedures: none Augmentation:  none Complications: None  Hospital course: Onset of Labor With Vaginal Delivery      25y.o. yo G3P1011 at 358w1das admitted in Latent Labor on 07/22/2021. Patient had an uncomplicated, precipitous labor course as follows:  Membrane Rupture Time/Date: 07/22/21 at 2145 Delivery Method:Vaginal, Spontaneous  Episiotomy: None  Lacerations:  None  Patient had an uncomplicated postpartum course.  She is ambulating, tolerating a regular diet, passing flatus, and urinating well. Patient is discharged home in stable condition on 07/24/21.  Newborn Data: Birth date:07/23/2021  Birth time:12:43 AM  Gender:Female  Living status:Living  Apgars:9 ,9  Weight:3147 g (6lb 15oz)  Magnesium Sulfate received: No BMZ received: No Rhophylac:N/A MMR:N/A T-DaP: offered postpartum Flu: No Transfusion:No  Physical exam  Vitals:   07/23/21 0730 07/23/21 1132 07/23/21 2211 07/24/21 0609  BP: 120/74 112/72 120/75 117/66  Pulse: 74 77 66 69  Resp: 18 16 18 18   Temp: 98.1 F (36.7 C) 97.9 F (36.6 C) 98 F (36.7 C) 98.1 F (36.7 C)  TempSrc: Oral Oral Oral Oral  SpO2:   100% 100%  Weight:      Height:       General: alert, cooperative, and no distress Lochia: appropriate Uterine Fundus: firm Incision: N/A DVT Evaluation: No evidence of DVT seen on  physical exam. Negative Homan's sign. No cords or calf tenderness. No significant calf/ankle edema. Labs: Lab Results  Component Value Date   WBC 12.2 (H) 07/22/2021   HGB 11.2 (L) 07/22/2021   HCT 33.3 (L) 07/22/2021   MCV 92.2 07/22/2021   PLT 285 07/22/2021   CMP Latest Ref Rng & Units 11/06/2015  Glucose 65 - 99 mg/dL 86  BUN 6 - 20 mg/dL 9  Creatinine 0.44 - 1.00 mg/dL 0.78  Sodium 135 - 145 mmol/L 136  Potassium 3.5 - 5.1 mmol/L 3.9  Chloride 101 - 111 mmol/L 106  CO2 22 - 32 mmol/L 24  Calcium 8.9 - 10.3 mg/dL 8.6(L)  Total Protein 6.0 - 8.3 g/dL -  Total Bilirubin 0.3 - 1.2 mg/dL -  Alkaline Phos 39 - 117 U/L -  AST 0 - 37 U/L -  ALT 0 - 35 U/L -   Edinburgh Score: Edinburgh Postnatal Depression Scale Screening Tool 07/23/2021  I have been able to laugh and see the funny side of things. 0  I have looked forward with enjoyment to things. 0  I have blamed myself unnecessarily when things went wrong. 0  I have been anxious or worried for no good reason. 0  I have felt scared or panicky for no good reason. 0  Things have been getting on top of me. 0  I have been  so unhappy that I have had difficulty sleeping. 0  I have felt sad or miserable. 0  I have been so unhappy that I have been crying. 0  The thought of harming myself has occurred to me. 0  Edinburgh Postnatal Depression Scale Total 0     After visit meds:  Allergies as of 07/24/2021   No Known Allergies      Medication List     STOP taking these medications    cetirizine 10 MG tablet Commonly known as: ZyrTEC Allergy   triamcinolone cream 0.1 % Commonly known as: KENALOG   valACYclovir 1000 MG tablet Commonly known as: VALTREX       TAKE these medications    acetaminophen 325 MG tablet Commonly known as: Tylenol Take 2 tablets (650 mg total) by mouth every 4 (four) hours as needed (for pain scale < 4).   ibuprofen 600 MG tablet Commonly known as: ADVIL Take 1 tablet (600 mg total) by  mouth every 6 (six) hours.   multivitamin-prenatal 27-0.8 MG Tabs tablet Take by mouth daily at 12 noon.         Discharge home in stable condition Infant Feeding: Breast Infant Disposition:home with mother Discharge instruction: per After Visit Summary and Postpartum booklet. Activity: Advance as tolerated. Pelvic rest for 6 weeks.  Diet: routine diet Future Appointments: Future Appointments  Date Time Provider [place]  08/13/2021 10:35 AM Laury Deep, CNM [facility] None   Follow up Visit:  Follow-up Information     [place] Follow up on 08/13/2021.   Specialty: Obstetrics and Gynecology Why: for postpartum checkup (can be virtual) Contact information: [place] [address] [phone]                Myrtis Ser, CNM  P [facility] Admin Please schedule this patient for Postpartum visit in: 6 weeks with the following provider: Any provider  Pt preference: virtual vs in person  For C/S patients schedule nurse incision check in weeks 2 weeks: no  Low risk pregnancy complicated by: late tx to care @ 37wks  Delivery mode:  SVD  Anticipated Birth Control:   PP Depo given (offered inpatient)  PP Procedures needed: trichomonas TOC  Schedule Integrated [place] visit: no    07/24/2021 Christin Fudge, CNM

## 2021-07-24 MED ORDER — IBUPROFEN 600 MG PO TABS: 600.0000 mg | ORAL_TABLET | Freq: Four times a day (QID) | ORAL | 0 refills | Status: DC

## 2021-07-24 MED ORDER — ACETAMINOPHEN 325 MG PO TABS: 650.0000 mg | ORAL_TABLET | ORAL | Status: DC | PRN

## 2021-07-24 NOTE — Progress Notes (Shared)
Patient ID: Sarah Hester, female   DOB: [birth year], 25 y.o.   MRN: [id number] POSTPARTUM PROGRESS NOTE  Post Partum Day 1  Subjective:  Sarah Hester is a 25 y.o. [id number] s/p SVD at [redacted]w[redacted]d.  No acute events overnight.  Pt denies problems with ambulating, voiding or po intake.  She denies nausea or vomiting.  Pain is well controlled.  She has had flatus. She has had bowel movement.  Lochia Small.   Objective: Blood pressure 117/66, pulse 69, temperature 98.1 F (36.7 C), temperature source Oral, resp. rate 18, height 5\' 6"  (1.676 m), weight 84.8 kg, last menstrual period 10/22/2020, SpO2 100 %, unknown if currently breastfeeding.  Physical Exam:  General: alert, cooperative and no distress Chest: no respiratory distress Heart:regular rate, distal pulses intact Abdomen: soft, nontender,  Uterine Fundus: firm, appropriately tender DVT Evaluation: No calf swelling or tenderness Extremities: without edema Skin: warm, dry;   Recent Labs    07/22/21 2307  HGB 11.2*  HCT 33.3*    Assessment/Plan: Sarah Hester is a 25 y.o. 27 s/p SVD at [redacted]w[redacted]d   PPD#1 - Doing well Contraception: Will receive depo shot before discharge Feeding: Breastfeeding Dispo: Plan for discharge Discharge today.   LOS: 2 days   [redacted]w[redacted]d, CNM 07/24/2021, 7:39 AM

## 2021-07-28 ENCOUNTER — Encounter: Payer: Medicaid Other | Admitting: Medical

## 2021-08-04 ENCOUNTER — Telehealth (HOSPITAL_COMMUNITY): Payer: Self-pay

## 2021-08-04 NOTE — Telephone Encounter (Signed)
No answer. Left message to return nurse call.  Marcelino Duster [facility] 08/04/2021,1508

## 2021-08-13 ENCOUNTER — Telehealth (INDEPENDENT_AMBULATORY_CARE_PROVIDER_SITE_OTHER): Payer: Medicaid Other | Admitting: Obstetrics and Gynecology

## 2021-08-13 ENCOUNTER — Encounter: Payer: Self-pay | Admitting: Obstetrics and Gynecology

## 2021-08-13 NOTE — Progress Notes (Addendum)
MY CHART VIDEO POSTPARTUM VISIT ENCOUNTER NOTE  I connected with Aerilyn Slee on 08/13/21 at 10:35 AM EST by My Chart video at home and verified that I am speaking with the correct person using two identifiers. Provider located at [facility] during visit.   I discussed the limitations, risks, security and privacy concerns of performing an evaluation and management service by My Chart video and the availability of in person appointments. I also discussed with the patient that there may be a patient responsible charge related to this service. The patient expressed understanding and agreed to proceed.    Kasheena Sambrano is a 25 y.o. [id number] female who presents for a postpartum visit. She is 3 weeks postpartum following a normal spontaneous vaginal delivery.  I have fully reviewed the prenatal and intrapartum course. The delivery was at [redacted]w[redacted]d gestational weeks.  Anesthesia: none. Postpartum course has been uncomplicated. Baby "Miachel Roux" is doing well. Baby is feeding by breast. Bleeding no bleeding. Bowel function is normal. Bladder function is normal. Patient is not sexually active. Contraception method is none. Postpartum depression screening: negative.   The pregnancy intention screening data noted above was reviewed. Potential methods of contraception were discussed. The patient elected to proceed with Depo-Provera.   Edinburgh Postnatal Depression Scale - 08/13/21 1059       Edinburgh Postnatal Depression Scale:  In the Past 7 Days   I have been able to laugh and see the funny side of things. 0    I have looked forward with enjoyment to things. 0    I have blamed myself unnecessarily when things went wrong. 0    I have been anxious or worried for no good reason. 0    I have felt scared or panicky for no good reason. 0    Things have been getting on top of me. 0    I have been so unhappy that I have had difficulty sleeping. 0    I have felt sad or miserable. 0    I have been so  unhappy that I have been crying. 0    The thought of harming myself has occurred to me. 0    Edinburgh Postnatal Depression Scale Total 0             Health Maintenance Due  Topic Date Due   COVID-19 Vaccine (1) Never done   Hepatitis C Screening  Never done    The following portions of the patient's history were reviewed and updated as appropriate: allergies, current medications, past family history, past medical history, past social history, past surgical history, and problem list.  Review of Systems Constitutional: negative Eyes: negative Ears, nose, mouth, throat, and face: negative Respiratory: negative Cardiovascular: negative Gastrointestinal: negative Genitourinary:negative Integument/breast: negative Hematologic/lymphatic: negative Musculoskeletal:negative Neurological: negative Behavioral/Psych: negative Endocrine: negative Allergic/Immunologic: negative  Objective:  There were no vitals taken for this visit. - **Patient does not own a BP cuff or scale**  General:  alert, cooperative, and no distress   Breasts:  Not examined - virtual visit  Lungs: Not examined - virtual visit  Heart:  Not examined - virtual visit  Abdomen:  Not examined - virtual visit  Wound Not examined - virtual visit  GU exam:  Not examined - virtual visit       Assessment:  Encounter for postpartum care of lactating mother  - Normal postpartum exam.   Plan:   Essential components of care per ACOG recommendations:  1.  Mood and well being:  Patient with negative depression screening today. Reviewed local resources for support.  - Patient tobacco use? No.   - hx of drug use? No.    2. Infant care and feeding:  -Patient currently breastmilk feeding? Yes. Reviewed importance of draining breast regularly to support lactation.  -Social determinants of health (SDOH) reviewed in EPIC. No concerns  3. Sexuality, contraception and birth spacing - Patient does not want a pregnancy in  the next year.  Desired family size is 1 children.  - Reviewed forms of contraception in tiered fashion. Patient desired Depo-Provera today.   - Discussed birth spacing of 18 months  4. Sleep and fatigue -Encouraged family/partner/community support of 4 hrs of uninterrupted sleep to help with mood and fatigue  5. Physical Recovery  - Discussed patients delivery and complications. She describes her labor as good. - Patient had a Vaginal, no problems at delivery. Patient had  no  laceration. Perineal healing reviewed. Patient expressed understanding - Patient has urinary incontinence? No. - Patient is safe to resume physical and sexual activity  6.  Health Maintenance - HM due items addressed Yes - Last pap smear 12/30/2020 Pap smear not done at today's visit.  -Breast Cancer screening indicated? No.   7. Chronic Disease/Pregnancy Condition follow up: None  - PCP follow up  Raelyn Mora, CNM [facility] [employer], [facility]

## 2021-08-17 ENCOUNTER — Ambulatory Visit: Payer: Medicaid Other

## 2021-08-25 ENCOUNTER — Ambulatory Visit: Payer: Medicaid Other

## 2021-08-25 ENCOUNTER — Other Ambulatory Visit: Payer: Self-pay | Admitting: *Deleted

## 2021-08-25 DIAGNOSIS — Z3042 Encounter for surveillance of injectable contraceptive: Secondary | ICD-10-CM

## 2021-08-25 MED ORDER — MEDROXYPROGESTERONE ACETATE 150 MG/ML IM SUSP: 150.0000 mg | INTRAMUSCULAR | 3 refills | Status: DC

## 2022-05-04 ENCOUNTER — Ambulatory Visit (HOSPITAL_COMMUNITY)
Admission: EM | Admit: 2022-05-04 | Discharge: 2022-05-04 | Disposition: A | Discharge status: Home / Self Care | Payer: Medicaid Other

## 2022-05-04 ENCOUNTER — Encounter (HOSPITAL_COMMUNITY): Payer: Self-pay | Admitting: *Deleted

## 2022-05-04 DIAGNOSIS — T7840XA Allergy, unspecified, initial encounter: Secondary | ICD-10-CM | POA: Diagnosis not present

## 2022-05-04 DIAGNOSIS — W57XXXA Bitten or stung by nonvenomous insect and other nonvenomous arthropods, initial encounter: Secondary | ICD-10-CM | POA: Diagnosis not present

## 2022-05-04 DIAGNOSIS — S80261A Insect bite (nonvenomous), right knee, initial encounter: Secondary | ICD-10-CM

## 2022-05-04 DIAGNOSIS — L299 Pruritus, unspecified: Secondary | ICD-10-CM | POA: Diagnosis not present

## 2022-05-04 MED ORDER — LORATADINE 10 MG PO TABS: 10.0000 mg | ORAL_TABLET | Freq: Every day | ORAL | 0 refills | Status: DC

## 2022-05-04 MED ORDER — TRIAMCINOLONE ACETONIDE 0.1 % EX CREA: 1.0000 | TOPICAL_CREAM | Freq: Three times a day (TID) | CUTANEOUS | 0 refills | Status: DC

## 2022-05-04 NOTE — ED Triage Notes (Signed)
Pt states that she was stung by some sort of bee Sunday behind her right knee and since has been swelling and hurting more. She out some hydrocortisone cream on it for the itching.

## 2022-05-04 NOTE — Discharge Instructions (Signed)
Advised to continue icing the area, 10 minutes on 20 minutes off, 3-4 times throughout the day to help reduce the swelling and itching. Advised to apply the triamcinolone cream 3 times a day to the area to help reduce the swelling and itching. Advised take Benadryl 25 mg, every 6-8 hours as needed for itching, use this medicine with caution because it may cause drowsiness. Advised to use the Claritin 1 tablet daily to help reduce the itching and swelling. Advised follow-up PCP or return to urgent care if symptoms fail to improve.

## 2022-05-04 NOTE — ED Provider Notes (Signed)
[facility]    CSN: [id number] Arrival date & time: 05/04/22  0808      History   Chief Complaint Chief Complaint  Patient presents with   Insect Bite    HPI Sarah Hester is a 26 y.o. female.   26 year old female presents with right posterior knee itching.  Patient indicates that on Sunday she was stung by some type of insect, she believes it might have been a Journalist, newspaper.  She indicates that since then she started having itching, redness, and swelling at the sting site.  She indicates that she has been using ice to the area frequently and applying hydrocortisone cream with minimal relief.  She indicates that the area continues to itch, it is painful, and is swollen which makes it difficult for her to bend her right leg.  Patient denies any fever, chills, no nausea or vomiting.     Past Medical History:  Diagnosis Date   HSV (herpes simplex virus) infection    Medical history non-contributory     Patient Active Problem List   Diagnosis Date Noted   Herpes simplex 07/23/2021   Labor and delivery, indication for care 07/23/2021   Antepartum variable deceleration 07/12/2021   Supervision of other normal pregnancy, antepartum 07/09/2021   Trichomonal vaginitis during pregnancy 07/07/2021   Pyuria 06/14/2013   Abdominal pain 06/14/2013    Past Surgical History:  Procedure Laterality Date   NO PAST SURGERIES      OB History     Gravida  3   Para  2   Term  2   Preterm      AB  1   Living  2      SAB      IAB  1   Ectopic      Multiple  0   Live Births  2            Home Medications    Prior to Admission medications   Medication Sig Start Date End Date Taking? Authorizing Provider  loratadine (CLARITIN) 10 MG tablet Take 1 tablet (10 mg total) by mouth daily. For itching and swelling. 05/04/22  Yes Ellsworth Lennox, PA-C  triamcinolone cream (KENALOG) 0.1 % Apply 1 Application topically 3 (three) times daily. 05/04/22  Yes Ellsworth Lennox, PA-C  acetaminophen (TYLENOL) 325 MG tablet Take 2 tablets (650 mg total) by mouth every 4 (four) hours as needed (for pain scale < 4). Patient not taking: Reported on 08/13/2021 07/24/21   Jacklyn Shell, CNM  [employer] D3 125 MCG (5000 UT) capsule Take 5,000 Units by mouth once a week. 11/11/21   [provider]  ibuprofen (ADVIL) 600 MG tablet Take 1 tablet (600 mg total) by mouth every 6 (six) hours. Patient not taking: Reported on 08/13/2021 07/24/21   Cresenzo-Dishmon, Scarlette Calico, CNM  medroxyPROGESTERone (DEPO-PROVERA) 150 MG/ML injection Inject 1 mL (150 mg total) into the muscle every 3 (three) months. 08/25/21   Raelyn Mora, CNM  Prenatal Vit-Fe Fumarate-FA (MULTIVITAMIN-PRENATAL) 27-0.8 MG TABS tablet Take by mouth daily at 12 noon. Patient not taking: Reported on 08/13/2021    [provider]    Family History Family History  Problem Relation Age of Onset   Diabetes Maternal Grandmother    Heart disease Neg Hx    Hypertension Neg Hx    Stroke Neg Hx    Mental illness Neg Hx    Drug abuse Neg Hx    Depression Neg Hx     Social History  Social History   Tobacco Use   Smoking status: Never    Passive exposure: Never   Smokeless tobacco: Never  Vaping Use   Vaping Use: Never used  Substance Use Topics   Alcohol use: No   Drug use: No     Allergies   Patient has no known allergies.   Review of Systems Review of Systems  Skin:  Positive for rash (right posterior knee with swelling).     Physical Exam Triage Vital Signs ED Triage Vitals  Enc Vitals Group     BP 05/04/22 0845 121/79     Pulse Rate 05/04/22 0845 65     Resp 05/04/22 0845 18     Temp 05/04/22 0845 98.2 F (36.8 C)     Temp Source 05/04/22 0845 Oral     SpO2 05/04/22 0845 100 %     Weight --      Height --      Head Circumference --      Peak Flow --      Pain Score 05/04/22 0844 0     Pain Loc --      Pain Edu? --      Excl. in GC? --    No data  found.  Updated Vital Signs BP 121/79 (BP Location: Left Arm)   Pulse 65   Temp 98.2 F (36.8 C) (Oral)   Resp 18   LMP 04/17/2022 (Approximate)   SpO2 100%   Visual Acuity Right Eye Distance:   Left Eye Distance:   Bilateral Distance:    Right Eye Near:   Left Eye Near:    Bilateral Near:     Physical Exam Constitutional:      Appearance: Normal appearance.  Skin:         Comments: Right posterior knee: There is a 3 x 2 oval raised red rash at the sting site.  This is localized with minimal tenderness on palpation, there is no streaking or drainage associated.  F ROM normal.  Neurological:     Mental Status: She is alert.      UC Treatments / Results  Labs (all labs ordered are listed, but only abnormal results are displayed) Labs Reviewed - No data to display  EKG   Radiology No results found.  Procedures Procedures (including critical care time)  Medications Ordered in UC Medications - No data to display  Initial Impression / Assessment and Plan / UC Course  I have reviewed the triage vital signs and the nursing notes.  Pertinent labs & imaging results that were available during my care of the patient were reviewed by me and considered in my medical decision making (see chart for details).    Plan: 1.  Advised to continue icing the area, 10 minutes on 20 minutes off, 3-4 times throughout the day to help reduce the swelling and itching. 2.  Advised take Benadryl 25 mg 1 every 6-8 hours as needed for the itching and swelling, patient has been advised this may cause drowsiness and to use with caution. 3.  Patient advised to use the triamcinolone cream and apply to the area 3 times a day to help reduce the itching and swelling. 4.  Patient advised to use Claritin 1 tablet daily for the next several days to help reduce the itching and swelling. 5.  Patient advised to follow-up with PCP or return to urgent care if symptoms fail to improve. Final Clinical  Impressions(s) / UC Diagnoses   Final  diagnoses:  Insect bite of right knee, initial encounter  Itching  Allergic reaction, initial encounter     Discharge Instructions      Advised to continue icing the area, 10 minutes on 20 minutes off, 3-4 times throughout the day to help reduce the swelling and itching. Advised to apply the triamcinolone cream 3 times a day to the area to help reduce the swelling and itching. Advised take Benadryl 25 mg, every 6-8 hours as needed for itching, use this medicine with caution because it may cause drowsiness. Advised to use the Claritin 1 tablet daily to help reduce the itching and swelling. Advised follow-up PCP or return to urgent care if symptoms fail to improve.    ED Prescriptions     Medication Sig Dispense Auth. Provider   triamcinolone cream (KENALOG) 0.1 % Apply 1 Application topically 3 (three) times daily. 30 g Ellsworth Lennox, PA-C   loratadine (CLARITIN) 10 MG tablet Take 1 tablet (10 mg total) by mouth daily. For itching and swelling. 10 tablet Ellsworth Lennox, PA-C      PDMP not reviewed this encounter.   Ellsworth Lennox, PA-C 05/04/22 [phone]

## 2023-10-29 IMAGING — US US MFM FETAL BPP W/O NON-STRESS
1 series · 12 of 12 positions shown · non-contrast
Comparison: none

[Series 1: us mfm fetal bpp w/o non-stress · 12 acquisitions, 12 frames shown]
[im 1/12]
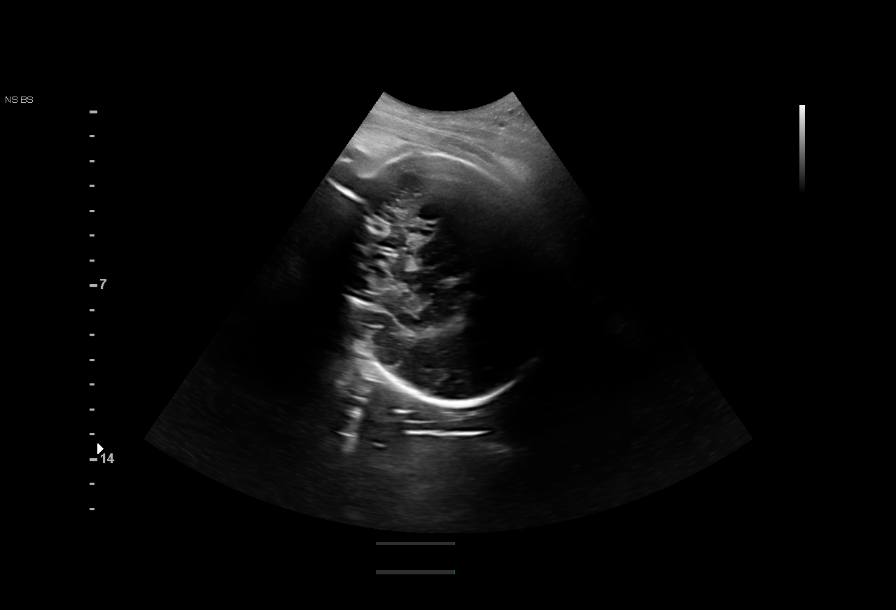
[im 2/12]
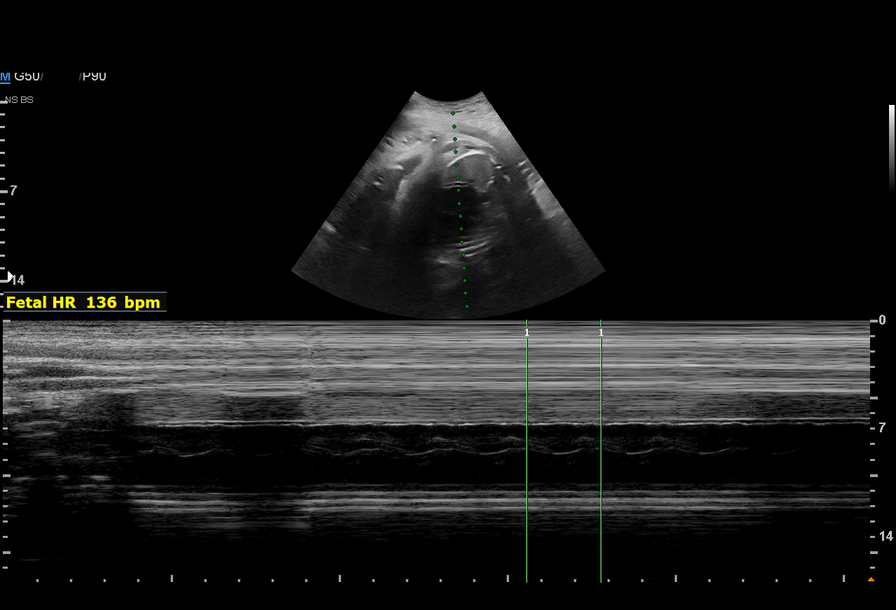
[im 3/12]
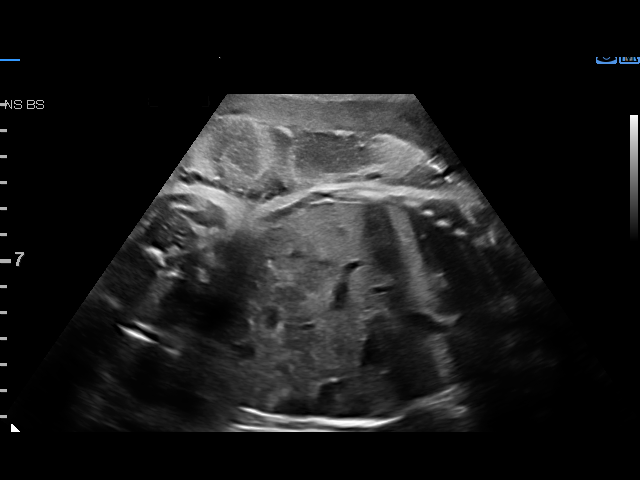
[im 4/12]
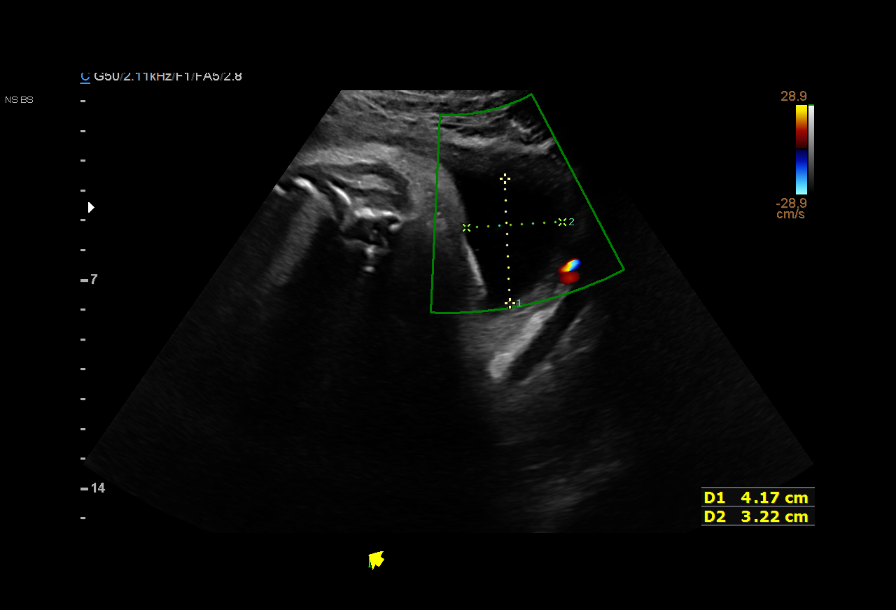
[im 5/12]
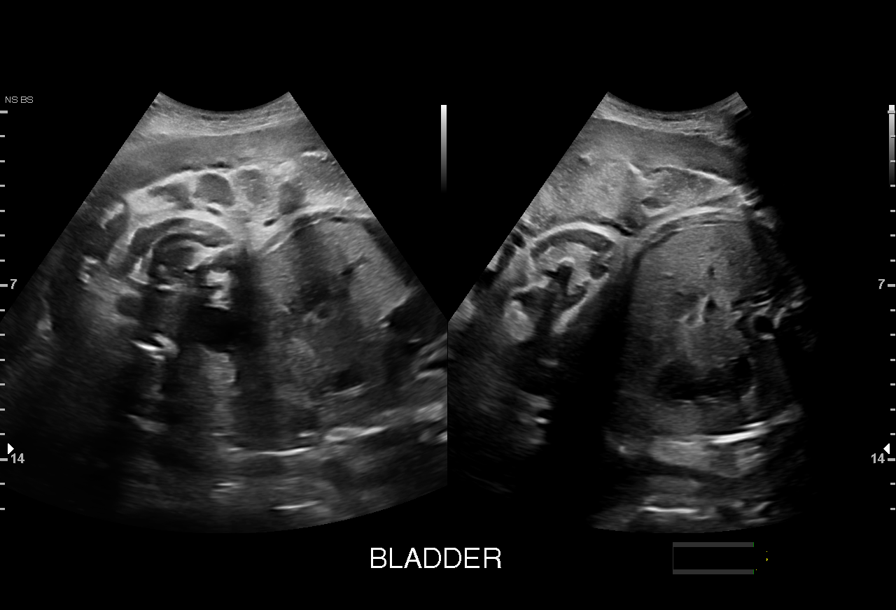
[im 6/12]
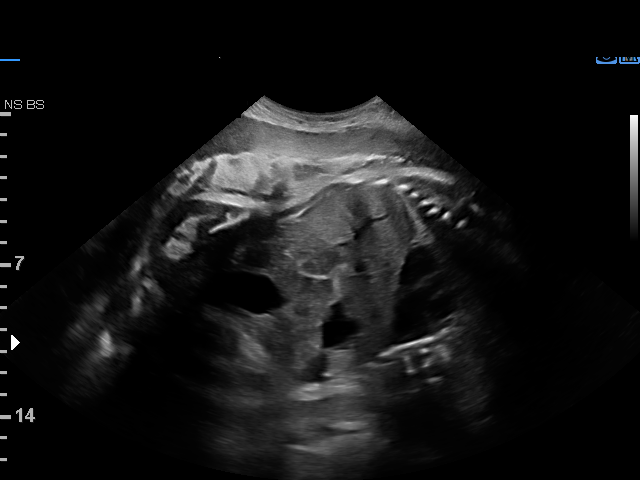
[im 7/12]
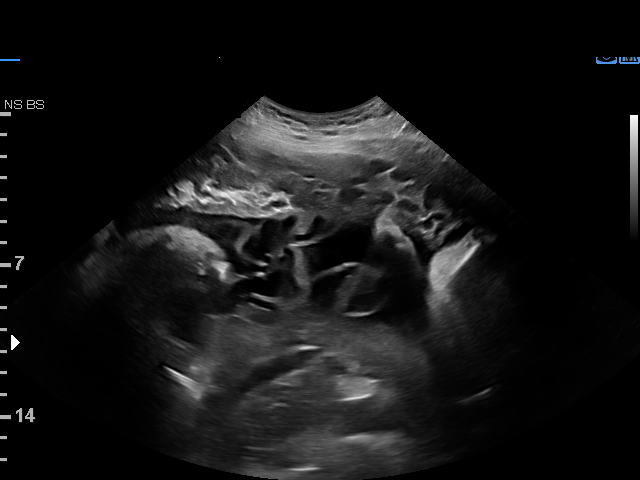
[im 8/12]
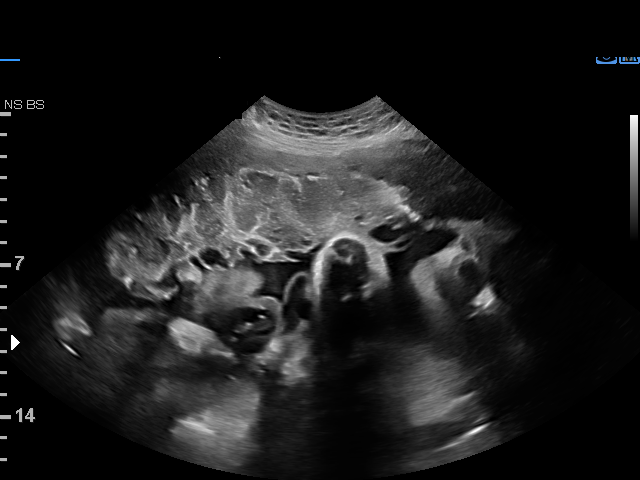
[im 9/12]
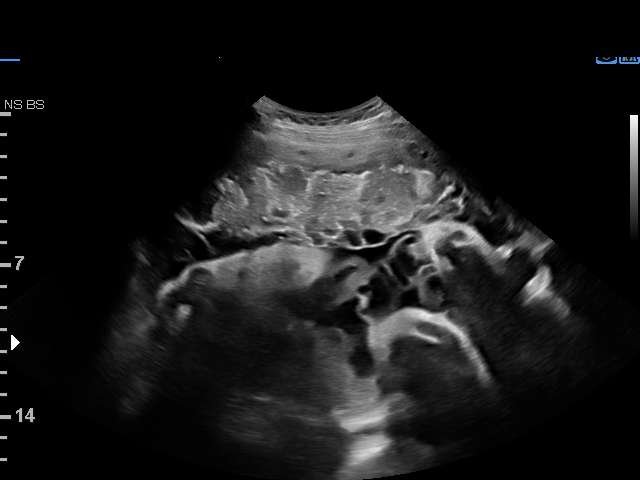
[im 10/12]
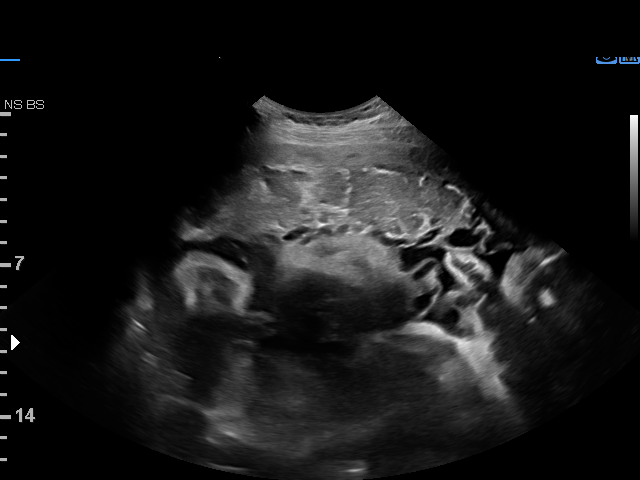
[im 11/12]
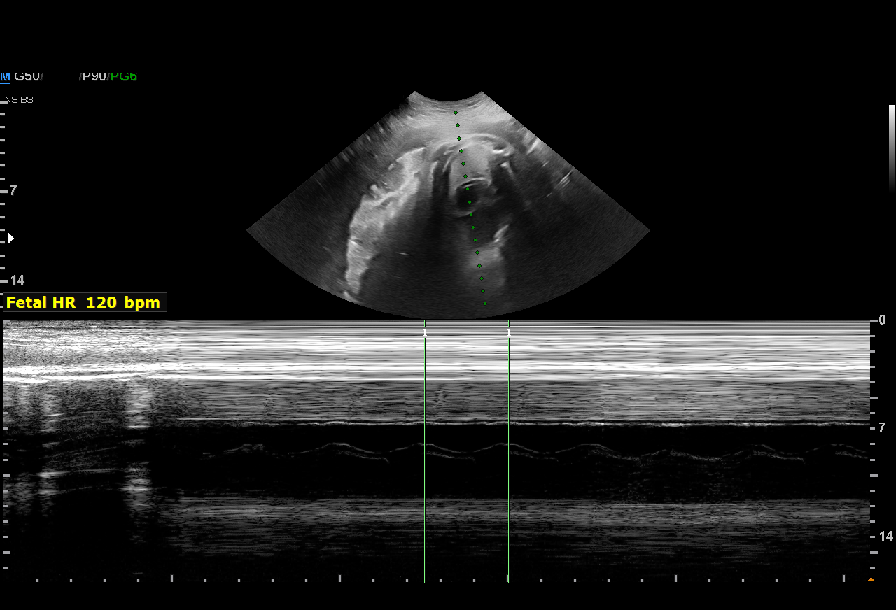
[im 12/12]
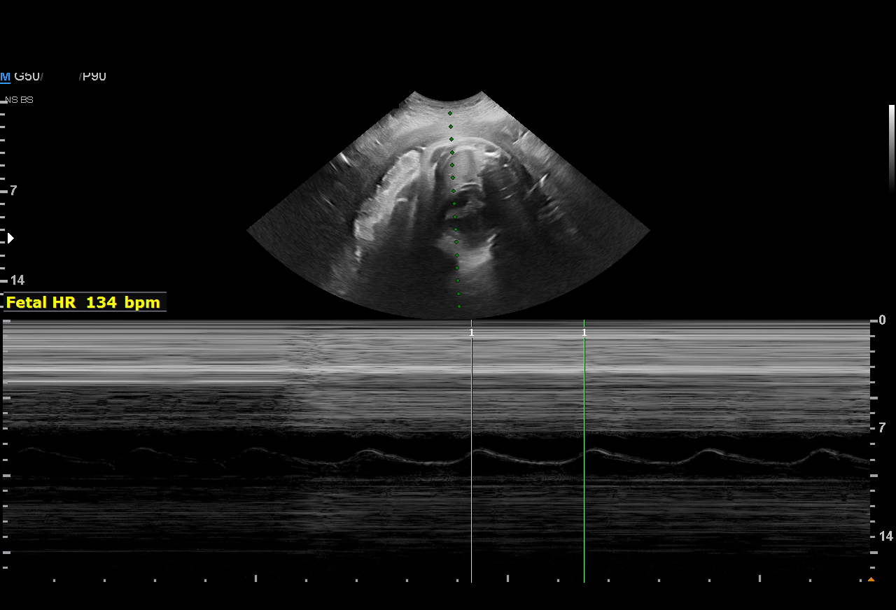

[12 of 12 positions shown; findings below may reference images not displayed]

Indications

 Fetal heart rate decelerations affecting
 management of mother
 37 weeks gestation of pregnancy
Fetal Evaluation

 Num Of Fetuses:          1
 Fetal Heart Rate(bpm):   136
 Cardiac Activity:        Observed
 Presentation:            Cephalic
 Placenta:                Anterior

 Amniotic Fluid
 AFI FV:      Within normal limits

 AFI Sum(cm)     %Tile       Largest Pocket(cm)
 12.6            44

 RUQ(cm)       RLQ(cm)       LUQ(cm)        LLQ(cm)
 6.2           4.1           0
Biophysical Evaluation

 Amniotic F.V:   Within normal limits       F. Tone:         Observed
 F. Movement:    Observed                   Score:           [DATE]
 F. Breathing:   Observed
Gestational Age

 Clinical EDD:  37w 3d                                        EDD:   07/29/21
 Best:          37w 3d     Det. By:  Clinical EDD             EDD:   07/29/21
Impression

 Antenatal testing performed given fetal deceleration
 The biophysical profile was [DATE] with good fetal movement and
 amniotic fluid volume.
Recommendations

 Clinical correlation recommended.

## 2023-12-29 ENCOUNTER — Encounter (HOSPITAL_COMMUNITY): Payer: Self-pay | Admitting: *Deleted

## 2023-12-29 ENCOUNTER — Ambulatory Visit (HOSPITAL_COMMUNITY)
Admission: EM | Admit: 2023-12-29 | Discharge: 2023-12-29 | Disposition: A | Discharge status: Home / Self Care | Attending: Family Medicine | Admitting: Family Medicine

## 2023-12-29 DIAGNOSIS — L259 Unspecified contact dermatitis, unspecified cause: Secondary | ICD-10-CM

## 2023-12-29 DIAGNOSIS — R21 Rash and other nonspecific skin eruption: Secondary | ICD-10-CM | POA: Diagnosis not present

## 2023-12-29 DIAGNOSIS — Z3201 Encounter for pregnancy test, result positive: Secondary | ICD-10-CM

## 2023-12-29 LAB — POCT URINE PREGNANCY: Preg Test, Ur: POSITIVE — AB

## 2023-12-29 NOTE — ED Provider Notes (Signed)
 [facility]    CSN: [id number] Arrival date & time: 12/29/23  4010     History   Chief Complaint Chief Complaint  Patient presents with   Rash    HPI Sarah Hester is a 28 y.o. female.  About a week or two of itching rash on neck. Also noted a spot on the face and the arm. Has used topical hydrocortisone a few times with temporary relief. She is currently 1-2 months pregnant and wasn't sure what else she could use. No known exposures to new products including food or medications. Not having shortness of breath, wheezing, oral swelling  Past Medical History:  Diagnosis Date   HSV (herpes simplex virus) infection    Medical history non-contributory     Patient Active Problem List   Diagnosis Date Noted   Herpes simplex 07/23/2021   Labor and delivery, indication for care 07/23/2021   Antepartum variable deceleration 07/12/2021   Supervision of other normal pregnancy, antepartum 07/09/2021   Trichomonal vaginitis during pregnancy 07/07/2021   Pyuria 06/14/2013   Abdominal pain 06/14/2013    Past Surgical History:  Procedure Laterality Date   NO PAST SURGERIES      OB History     Gravida  4   Para  2   Term  2   Preterm      AB  1   Living  2      SAB      IAB  1   Ectopic      Multiple  0   Live Births  2            Home Medications    Prior to Admission medications   Medication Sig Start Date End Date Taking? Authorizing Provider  [employer]S D3 125 MCG (5000 UT) capsule Take 5,000 Units by mouth once a week. 11/11/21  Yes [provider]  Prenatal Vit-Fe Fumarate-FA (MULTIVITAMIN-PRENATAL) 27-0.8 MG TABS tablet Take by mouth daily at 12 noon.   Yes [provider]  acetaminophen  (TYLENOL ) 325 MG tablet Take 2 tablets (650 mg total) by mouth every 4 (four) hours as needed (for pain scale < 4). Patient not taking: Reported on 08/13/2021 07/24/21   Majel Scott, CNM    Family History Family History   Problem Relation Age of Onset   Diabetes Maternal Grandmother    Heart disease Neg Hx    Hypertension Neg Hx    Stroke Neg Hx    Mental illness Neg Hx    Drug abuse Neg Hx    Depression Neg Hx     Social History Social History   Tobacco Use   Smoking status: Never    Passive exposure: Never   Smokeless tobacco: Never  Vaping Use   Vaping status: Never Used  Substance Use Topics   Alcohol use: No   Drug use: No     Allergies   Patient has no known allergies.   Review of Systems Review of Systems  Skin:  Positive for rash.   Per HPI  Physical Exam Triage Vital Signs ED Triage Vitals [12/29/23 0923]  Encounter Vitals Group     BP 116/69     Systolic BP Percentile      Diastolic BP Percentile      Pulse Rate 85     Resp 20     Temp 98 F (36.7 C)     Temp src      SpO2 100 %     Weight  Height      Head Circumference      Peak Flow      Pain Score 0     Pain Loc      Pain Education      Exclude from Growth Chart    No data found.  Updated Vital Signs BP 116/69   Pulse 85   Temp 98 F (36.7 C)   Resp 20   LMP 11/08/2023 Comment: PT reports pregnant test was at [facility].  SpO2 100%    Physical Exam Vitals and nursing note reviewed.  Constitutional:      General: She is not in acute distress.    Appearance: Normal appearance.  HENT:     Mouth/Throat:     Mouth: Mucous membranes are moist.     Pharynx: Oropharynx is clear. No posterior oropharyngeal erythema.  Eyes:     Conjunctiva/sclera: Conjunctivae normal.     Pupils: Pupils are equal, round, and reactive to light.  Cardiovascular:     Rate and Rhythm: Normal rate and regular rhythm.     Pulses: Normal pulses.     Heart sounds: Normal heart sounds.  Pulmonary:     Effort: Pulmonary effort is normal. No respiratory distress.     Breath sounds: Normal breath sounds. No wheezing.  Musculoskeletal:        General: Normal range of motion.     Cervical back: Normal range of  motion.  Skin:    Findings: Rash present.     Comments: Small area of rash on bilateral neck. There is one area of rash over left eyebrow, and one in the left antecubital space  Neurological:     Mental Status: She is alert and oriented to person, place, and time.     UC Treatments / Results  Labs (all labs ordered are listed, but only abnormal results are displayed) Labs Reviewed  POCT URINE PREGNANCY - Abnormal; Notable for the following components:      Result Value   Preg Test, Ur Positive (*)    All other components within normal limits    EKG  Radiology No results found.  Procedures Procedures   Medications Ordered in UC Medications - No data to display  Initial Impression / Assessment and Plan / UC Course  I have reviewed the triage vital signs and the nursing notes.  Pertinent labs & imaging results that were available during my care of the patient were reviewed by me and considered in my medical decision making (see chart for details).  UPT positive Rash on neck, over eyebrow, and left arm. Consistent with contact vs atopic dermatitis. Recommend continue hydrocortisone, can use twice daily for 1 week. Advised zyrtec  and benadryl  safe in pregnancy, can help itch. She has OB visit coming up in a few weeks, can discuss with them as well if needed. Otherwise no red flags. Not likely fungal or bacterial, reassurance provided. Patient agrees to plan, no questions. Note for work is requested and provided  Final Clinical Impressions(s) / UC Diagnoses   Final diagnoses:  Contact dermatitis, unspecified contact dermatitis type, unspecified trigger  Rash     Discharge Instructions      Zyrtec  once daily for itching You can also use alcohol free benadryl  (at night) Hydrocortisone cream twice daily if needed    ED Prescriptions   None    PDMP not reviewed this encounter.   Romey Mathieson, Ivette Marks, [place] 12/29/23 1059

## 2023-12-29 NOTE — ED Triage Notes (Signed)
 PT has a rash that started on neck and has spread . Pt reports she is pregnant and was not sure what she could take . Pt has a OB appt on 5-13.

## 2023-12-29 NOTE — Discharge Instructions (Signed)
 Zyrtec  once daily for itching You can also use alcohol free benadryl  (at night) Hydrocortisone cream twice daily if needed

## 2024-01-08 ENCOUNTER — Inpatient Hospital Stay (HOSPITAL_COMMUNITY)

## 2024-01-08 ENCOUNTER — Inpatient Hospital Stay (HOSPITAL_COMMUNITY)
Admission: AD | Admit: 2024-01-08 | Discharge: 2024-01-08 | Disposition: A | Discharge status: Home / Self Care | Attending: Obstetrics & Gynecology | Admitting: Obstetrics & Gynecology

## 2024-01-08 ENCOUNTER — Encounter (HOSPITAL_COMMUNITY): Payer: Self-pay | Admitting: Obstetrics & Gynecology

## 2024-01-08 ENCOUNTER — Other Ambulatory Visit: Payer: Self-pay

## 2024-01-08 DIAGNOSIS — O209 Hemorrhage in early pregnancy, unspecified: Secondary | ICD-10-CM | POA: Diagnosis present

## 2024-01-08 DIAGNOSIS — O3680X0 Pregnancy with inconclusive fetal viability, not applicable or unspecified: Secondary | ICD-10-CM

## 2024-01-08 DIAGNOSIS — O200 Threatened abortion: Secondary | ICD-10-CM | POA: Diagnosis not present

## 2024-01-08 DIAGNOSIS — Z3A08 8 weeks gestation of pregnancy: Secondary | ICD-10-CM | POA: Insufficient documentation

## 2024-01-08 LAB — CBC
HCT: 39.1 % (ref 36.0–46.0)
Hemoglobin: 13.6 g/dL (ref 12.0–15.0)
MCH: 30.9 pg (ref 26.0–34.0)
MCHC: 34.8 g/dL (ref 30.0–36.0)
MCV: 88.9 fL (ref 80.0–100.0)
Platelets: 324 10*3/uL (ref 150–400)
RBC: 4.4 MIL/uL (ref 3.87–5.11)
RDW: 12.1 % (ref 11.5–15.5)
WBC: 8.4 10*3/uL (ref 4.0–10.5)
nRBC: 0 % (ref 0.0–0.2)

## 2024-01-08 LAB — HIV ANTIBODY (ROUTINE TESTING W REFLEX): HIV Screen 4th Generation wRfx: NONREACTIVE

## 2024-01-08 LAB — WET PREP, GENITAL
Sperm: NONE SEEN
Trich, Wet Prep: NONE SEEN
WBC, Wet Prep HPF POC: <10 (ref <10)
Yeast Wet Prep HPF POC: NONE SEEN

## 2024-01-08 LAB — RPR: RPR Ser Ql: NONREACTIVE

## 2024-01-08 LAB — HCG, QUANTITATIVE, PREGNANCY: hCG, Beta Chain, Quant, S: 63373 m[IU]/mL — ABNORMAL HIGH (ref <5)

## 2024-01-08 MED ORDER — ONDANSETRON 4 MG PO TBDP: 4.0000 mg | ORAL_TABLET | Freq: Three times a day (TID) | ORAL | 0 refills | Status: AC | PRN

## 2024-01-08 MED ORDER — ONDANSETRON 4 MG PO TBDP
8.0000 mg | ORAL_TABLET | Freq: Once | ORAL | Status: AC
  Administered 2024-01-08: 8 mg via ORAL
  Filled 2024-01-08: qty 2

## 2024-01-08 MED ORDER — ACETAMINOPHEN-CODEINE 300-30 MG PO TABS: 1.0000 | ORAL_TABLET | Freq: Four times a day (QID) | ORAL | 0 refills | Status: AC | PRN

## 2024-01-08 MED ORDER — IBUPROFEN 600 MG PO TABS
600.0000 mg | ORAL_TABLET | Freq: Once | ORAL | Status: AC
Start: 2024-01-08 — End: 2024-01-08
  Administered 2024-01-08: 600 mg via ORAL
  Filled 2024-01-08: qty 1

## 2024-01-08 MED ORDER — IBUPROFEN 800 MG PO TABS
800.0000 mg | ORAL_TABLET | Freq: Three times a day (TID) | ORAL | 0 refills | Status: AC | PRN
Start: 2024-01-08 — End: ?

## 2024-01-08 NOTE — MAU Note (Signed)
 MAU Triage Note  .Sarah Hester is a 28 y.o. at [redacted]w[redacted]d here in MAU reporting: vaginal bleeding and cramping starting this AM. Bleeding is dark red with clots. Has had to change pad twice since 9am. Also reporting nausea.    Onset of complaint: this AM Pain score: 7/10 Vitals:   01/08/24 0925  BP: 132/85  Pulse: (!) 103  Resp: 18  Temp: 98.3 F (36.8 C)  SpO2: 100%      Lab orders placed from triage: UA

## 2024-01-08 NOTE — MAU Provider Note (Signed)
 History     CSN: [id number]  Arrival date and time: 01/08/24 0900   Event Date/Time   First Provider Initiated Contact with Patient 01/08/24 [phone]      Chief Complaint  Patient presents with   Vaginal Bleeding   HPI [name] is a 28 y.o. year old G50P2012 female at [redacted]w[redacted]d weeks gestation who presents to MAU reporting heavy vaginal bleeding and abdominal pain/cramping that started this morning. She describes the bleeding as dark red with clots. She reports she has had to change her pad twice in 20 minutes. She also reports having nausea. She had a (+) UPT @ [facility] on 12/29/2023. She plans to receive [facility] with [facility]; next appt is 01/17/2024. Her sister is present and contributing to the history taking.   Per RN note @ [phone] the patient came out of BR stating, "I think my baby just came out." Per RN's assessment of urine cup, there is possible POC in the cup.  OB History     Gravida  4   Para  2   Term  2   Preterm      AB  1   Living  2      SAB      IAB  1   Ectopic      Multiple  0   Live Births  2           Past Medical History:  Diagnosis Date   HSV (herpes simplex virus) infection    Medical history non-contributory     Past Surgical History:  Procedure Laterality Date   NO PAST SURGERIES      Family History  Problem Relation Age of Onset   Diabetes Maternal Grandmother    Heart disease Neg Hx    Hypertension Neg Hx    Stroke Neg Hx    Mental illness Neg Hx    Drug abuse Neg Hx    Depression Neg Hx     Social History   Tobacco Use   Smoking status: Never    Passive exposure: Never   Smokeless tobacco: Never  Vaping Use   Vaping status: Never Used  Substance Use Topics   Alcohol use: No   Drug use: No    Allergies: No Known Allergies  No medications prior to admission.    Review of Systems  Constitutional: Negative.   HENT: Negative.    Eyes: Negative.   Respiratory: Negative.     Cardiovascular: Negative.   Gastrointestinal: Negative.   Endocrine: Negative.   Genitourinary:  Positive for pelvic pain and vaginal bleeding.  Musculoskeletal: Negative.   Skin: Negative.   Allergic/Immunologic: Negative.   Neurological: Negative.   Hematological: Negative.   Psychiatric/Behavioral: Negative.     Physical Exam   Blood pressure 132/85, pulse (!) 103, temperature 98.3 F (36.8 C), temperature source Oral, resp. rate 18, height 5\' 6"  (1.676 m), weight 82.5 kg, last menstrual period 11/08/2023, SpO2 100%, unknown if currently breastfeeding.  Physical Exam Vitals and nursing note reviewed. Exam conducted with a chaperone present.  Constitutional:      Appearance: Normal appearance. She is normal weight.  Cardiovascular:     Rate and Rhythm: Tachycardia present.  Pulmonary:     Effort: Pulmonary effort is normal.  Abdominal:     General: Abdomen is flat.     Palpations: Abdomen is soft.  Genitourinary:    General: Normal vulva.     Comments: Pelvic exam: External genitalia  normal, SE: vaginal walls pink and well rugated, cervix is smooth, pink, no lesions, small to moderate amt of bright, red blood in vaginal vault -- WP, GC/CT done, cervix visually dilated to ~1 cm, Uterus is non-tender, no CMT or friability, no adnexal tenderness.  Dilation: 1 Effacement (%): Thick Cervical Position: Anterior (uterus retroverted) Exam by: Baby Bolt, CNM  Musculoskeletal:        General: Normal range of motion.  Skin:    General: Skin is warm and dry.  Neurological:     Mental Status: She is alert and oriented to person, place, and time.  Psychiatric:        Mood and Affect: Mood normal.        Behavior: Behavior normal.        Thought Content: Thought content normal.        Judgment: Judgment normal.    MAU Course  Procedures  MDM [facility] UPT CBC ABO/Rh -- not drawn; known B Pos HCG Wet Prep GC/CT -- Results pending  RPR -- Results pending  OB U/S < 14 wks  TVUS  Results for orders placed or performed during the hospital encounter of 01/08/24 (from the past 24 hours)  CBC     Status: None   Collection Time: 01/08/24  9:46 AM  Result Value Ref Range   WBC 8.4 4.0 - 10.5 K/uL   RBC 4.40 3.87 - 5.11 MIL/uL   Hemoglobin 13.6 12.0 - 15.0 g/dL   HCT 40.9 81.1 - 91.4 %   MCV 88.9 80.0 - 100.0 fL   MCH 30.9 26.0 - 34.0 pg   MCHC 34.8 30.0 - 36.0 g/dL   RDW 78.2 95.6 - 21.3 %   Platelets 324 150 - 400 K/uL   nRBC 0.0 0.0 - 0.2 %  hCG, quantitative, pregnancy     Status: Abnormal   Collection Time: 01/08/24  9:46 AM  Result Value Ref Range   hCG, Beta Chain, Quant, S 63,373 (H) <5 mIU/mL  HIV Antibody (routine testing w rflx)     Status: None   Collection Time: 01/08/24  9:46 AM  Result Value Ref Range   HIV Screen 4th Generation wRfx Non Reactive Non Reactive  RPR     Status: None   Collection Time: 01/08/24  9:46 AM  Result Value Ref Range   RPR Ser Ql NON REACTIVE NON REACTIVE  Wet prep, genital     Status: Abnormal   Collection Time: 01/08/24 10:07 AM  Result Value Ref Range   Yeast Wet Prep HPF POC NONE SEEN NONE SEEN   Trich, Wet Prep NONE SEEN NONE SEEN   Clue Cells Wet Prep HPF POC PRESENT (A) NONE SEEN   WBC, Wet Prep HPF POC <10 <10   Sperm NONE SEEN     US  OB LESS THAN 14 WEEKS WITH OB TRANSVAGINAL Result Date: 01/08/2024 CLINICAL DATA:  Vaginal bleeding in early pregnancy. EXAM: OBSTETRIC <14 WK US  AND TRANSVAGINAL OB US  TECHNIQUE: Both transabdominal and transvaginal ultrasound examinations were performed for complete evaluation of the gestation as well as the maternal uterus, adnexal regions, and pelvic cul-de-sac. Transvaginal technique was performed to assess early pregnancy. COMPARISON:  None Available. FINDINGS: Intrauterine gestational sac: None Yolk sac:  Not Visualized. Embryo:  Not Visualized. Cardiac Activity: Not Visualized. Subchorionic hemorrhage:  None visualized. Maternal uterus/adnexae: Right ovary: Normal Left  ovary: Normal Other :Retroverted uterus. Endometrium measures 15.0 mm in thickness. Minimal color Doppler flow identified within the thickened endometrium. Free fluid:  No free fluid. IMPRESSION: 1. No intrauterine gestational sac, yolk sac, or fetal pole identified. Differential considerations include intrauterine pregnancy too early to be sonographically visualized, missed abortion, or ectopic pregnancy. Followup ultrasound is recommended in 10-14 days for further evaluation. 2. In the setting of a known spontaneous abortion thickened endometrium of 15 mm with increased blood flow is suggestive of retained products. Clinical correlation advised. Electronically Signed   By: Kimberley Penman M.D.   On: 01/08/2024 11:11    Assessment and Plan  1. Pregnancy of unknown anatomic location (Primary) - Explained this dx is d/t no documented IUP to compare to today's U/S showing no IUP.  2. Vaginal bleeding affecting early pregnancy - Information provided on vaginal bleeding in pregnancy - Return to MAU: If you have heavier bleeding that soaks through more that 2 pads per hour for an hour or more If you bleed so much that you feel like you might pass out or you do pass out If you have significant abdominal pain that is not improved with Tylenol  1000 mg every 8 hours as needed for pain If you develop a fever > 100.5    3. Threatened miscarriage in early pregnancy - Information provided on threatened miscarriage - Advised she is more than likely having a miscarriage - Specimen collected in MAU will be sent to pathology   4. [redacted] weeks gestation of pregnancy   - Discharge home - Call [facility] to change NOB appt to a F/U appt with provider after SAB - Patient verbalized an understanding of the plan of care and agrees.   Almond Army, CNM 01/08/2024, 9:50 AM

## 2024-01-08 NOTE — Discharge Instructions (Addendum)
 It is highly likely that you have had a miscarriage. Since we have no documentation of the pregnancy being in your uterus, we have to diagnose you with a pregnancy of unknown location (meaning we don't know where the pregnancy is). I have also ordered for the specimen that you collected while using the bathroom to go to pathology to get a formal diagnosis of miscarriage or not.  Please call [facility] tomorrow morning to get your new OB visit changed to a re-evaluation in 1-2 weeks with a provider there.  Return to MAU: If you have heavier bleeding that soaks through more that 2 pads per hour for an hour or more If you bleed so much that you feel like you might pass out or you do pass out If you have significant abdominal pain that is not improved with Ibuprofen  800 mg every 8 hours for cramping/pain OR Tylenol  #3 every 6 hours as needed for SEVERE pain If you develop a fever > 100.5

## 2024-01-08 NOTE — MAU Note (Signed)
 Patient was in restroom to get a urine sample when she came back out and stated "I think my baby just came out" This nurse to bathroom and noted blood and possible products of conception in the specimen cup. Patient appropriately tearful.  Cup labeled and patient brought to room for evaluation.

## 2024-01-09 LAB — GC/CHLAMYDIA PROBE AMP (~~LOC~~) NOT AT ARMC
Chlamydia: NEGATIVE
Comment: NEGATIVE
Comment: NORMAL
Neisseria Gonorrhea: NEGATIVE

## 2024-01-10 LAB — SURGICAL PATHOLOGY

## 2024-01-11 ENCOUNTER — Encounter: Payer: Self-pay | Admitting: Obstetrics and Gynecology

## 2024-09-12 ENCOUNTER — Encounter: Payer: Self-pay | Admitting: Neurology

## 2024-12-12 ENCOUNTER — Ambulatory Visit: Payer: Self-pay | Admitting: Neurology
